# Patient Record
Sex: Female | Born: 1995 | Hispanic: Yes | Marital: Single | Smoking: Never smoker
Health system: Southern US, Community
[De-identification: ages and names within clinical notes are randomized; demographics above are authoritative.]

## PROBLEM LIST (undated history)

## (undated) ENCOUNTER — Inpatient Hospital Stay (HOSPITAL_COMMUNITY): Payer: Self-pay

## (undated) ENCOUNTER — Ambulatory Visit: Admission: EM | Payer: BC Managed Care – PPO | Source: Home / Self Care

## (undated) DIAGNOSIS — D649 Anemia, unspecified: Secondary | ICD-10-CM

## (undated) HISTORY — PX: NO PAST SURGERIES: SHX2092

## (undated) HISTORY — DX: Anemia, unspecified: D64.9

---
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
## 2016-01-10 ENCOUNTER — Telehealth (HOSPITAL_COMMUNITY): Payer: Self-pay

## 2016-01-10 NOTE — Telephone Encounter (Signed)
Patients appt was rescheduled do to therapist is out with family emergency Will see her on 01-18-16.

## 2016-01-11 ENCOUNTER — Ambulatory Visit (HOSPITAL_COMMUNITY): Payer: No Typology Code available for payment source | Admitting: Physical Therapy

## 2016-01-18 ENCOUNTER — Ambulatory Visit (HOSPITAL_COMMUNITY): Payer: No Typology Code available for payment source | Attending: Orthopedic Surgery

## 2016-01-18 DIAGNOSIS — M5382 Other specified dorsopathies, cervical region: Secondary | ICD-10-CM

## 2016-01-18 DIAGNOSIS — M436 Torticollis: Secondary | ICD-10-CM | POA: Diagnosis present

## 2016-01-18 DIAGNOSIS — M542 Cervicalgia: Secondary | ICD-10-CM | POA: Insufficient documentation
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
## 2016-01-21 ENCOUNTER — Ambulatory Visit (HOSPITAL_COMMUNITY): Payer: No Typology Code available for payment source | Admitting: Physical Therapy

## 2016-01-25 ENCOUNTER — Ambulatory Visit (HOSPITAL_COMMUNITY): Payer: No Typology Code available for payment source | Admitting: Physical Therapy

## 2016-01-27 ENCOUNTER — Ambulatory Visit (HOSPITAL_COMMUNITY): Payer: No Typology Code available for payment source

## 2016-01-27 DIAGNOSIS — M436 Torticollis: Secondary | ICD-10-CM

## 2016-01-27 DIAGNOSIS — M542 Cervicalgia: Secondary | ICD-10-CM

## 2016-01-27 DIAGNOSIS — M5382 Other specified dorsopathies, cervical region: Secondary | ICD-10-CM

## 2016-01-27 NOTE — Therapy (Signed)
[place], [place], [address] Phone: [phone]   Fax:  [phone]  Physical Therapy Treatment  Patient Details  Name: Angela White MRN: [id number] Date of Birth: 10/20/1995 Referring Provider: Lynann Bologna   Encounter Date: 01/27/2016      PT End of Session - 01/27/16 1310    Visit Number 2   Number of Visits 16   Date for PT Re-Evaluation 02/18/16   Authorization Time Period 01/18/16-03/19/16 (2 months)    Authorization - Visit Number 2   Authorization - Number of Visits 16   PT Start Time 1118   PT Stop Time 1206   PT Time Calculation (min) 48 min   Activity Tolerance Patient tolerated treatment well;No increased pain   Behavior During Therapy [facility] for tasks assessed/performed      Past Medical History  Diagnosis Date  . Anemia     No past surgical history on file.  There were no vitals filed for this visit.      Subjective Assessment - 01/27/16 1121    Subjective Pt was here 9 days ago for eval. She now has a job at [employer] in [place], and [place] her summer classes. Pt reports she has been working on HEP 1x daily and is feeling better.    Currently in Pain? No/denies            [facility] PT Assessment - 01/27/16 0001    ROM / Strength   AROM / PROM / Strength PROM   PROM   Cervical Flexion chin touches sternum  27 degrees on 5/9 (eval)   Cervical Extension 54 degrees   18 degrees 5/9 (eval)    Cervical - Right Side Bend 60  30 on 5/9   Cervical - Left Side Bend 60  14 on 5/9 at eval   Cervical - Right Rotation 73 degrees (after stretches)   28 degrees on 5/9 (eval)    Cervical - Left Rotation 78 degrees (after stretches)   14 degrees on 5/9 (eval)    Flexibility   Soft Tissue Assessment /Muscle Length --  Mild tightness in upper traps L>R                     [facility] Adult PT Treatment/Exercise - 01/27/16 0001    Exercises   Exercises Neck;Shoulder   Neck  Exercises: Seated   Cervical Rotation --  moved to supine   Neck Exercises: Supine   Neck Retraction 10 reps;5 secs  into 2 pillowspillow   Other Supine Exercise AAROM c BUE: rotation 10x5sec bilat  63 degrees rotation bilat   Other Supine Exercise Bridge with towel roll at C7/T1  1x15 for HEP education   Shoulder Exercises: Supine   Other Supine Exercises Deep neck flexor endurance training  10x3 seconds (supine chintuck + 1" elevation) HEP ed   Manual Therapy   Manual Therapy Passive ROM   Passive ROM upper cervical AA rotation stretch   3x45 sec   Neck Exercises: Stretches   Upper Trapezius Stretch 3 reps;30 seconds   Upper Trapezius Stretch Limitations supine sidebending + scapular depression   Other Neck Stretches supine, end-range flexion + rotation (AO rotation)    3x30sec supine B, 1x30sec B sitting for HEP education                PT Education - 01/27/16 1310    Education provided Yes   Education Details Updated HEP; explained the role  of deep cervical flexors and current dominance by superficial system.    Person(s) Educated Patient   Methods Demonstration;Explanation   Comprehension Verbalized understanding;Returned demonstration          PT Short Term Goals - 01/27/16 1318    PT SHORT TERM GOAL #1   Title Pt will be in in HEP after 2 weeks.    Status Achieved   PT SHORT TERM GOAL #2   Title After 4 weeks patient will double tolerated sitting time to improve ability to participate in scholatic activities.    Status On-going   PT SHORT TERM GOAL #3   Title After 4 weeks, pt will increase cervical rotation ROM by 100% to improve safety and ease during driving.    Baseline At second visit, rotation increased 2-3x in range. flexion extention WNL now.    Status Achieved           PT Long Term Goals - 01/27/16 1319    PT LONG TERM GOAL #1   Title After 7 weeks pt will be indep in advanced HEP fro DC.    Status On-going   PT LONG TERM GOAL #2    Title After 8 weks pt will reports <3/10 pain during all functional activities to improve tolerance with turns and hair washing.    Status Partially Met   PT LONG TERM GOAL #3   Title After 8 weeks, pt will demonstrate >75 degrees cervical rotation bilat, >40 cervical extention, and >30 degree lateral flexion bilat, all without exacerbation of pain, to improve ability to safely drive and more easily perform hair washing.    Status Achieved               Plan - 01/27/16 1311    Clinical Impression Statement Pt making excellent progress toward goals, demonstrating improved ROM and activity tolerance after performing HEP independently for 8 days since evaluation. Part of HEP have been DC's and new actiivties added to continue progress. The patient is pleased with her porgress thus far, is a fast learner and is able to perform  new HEP activities as directed at end of session. I have asked patient to cancel her next scheduled appointment and return in 1 week due to great progress and high level of indep.    Rehab Potential Good   PT Frequency 2x / week   PT Duration 8 weeks   PT Treatment/Interventions Moist Heat;Therapeutic activities;Therapeutic exercise;Functional mobility training;Traction;Patient/family education;Manual techniques;Taping;Passive range of motion   PT Next Visit Plan Review new HEP additions from visit 2. Assess BUE overhead mechanics. Shoulder extention with band, and standing shoulder T's with band. Suspect DC will be approprait at 4th visit.    PT Home Exercise Plan DC'd Flexion and Extention; added bridging with towel roll at C7, high cervical rotational stretch, and Deep neck flexor endurance training.     Consulted and Agree with Plan of Care Patient      Patient will benefit from skilled therapeutic intervention in order to improve the following deficits and impairments:  Decreased activity tolerance, Decreased endurance, Decreased knowledge of precautions, Decreased  mobility, Decreased strength, Hypomobility, Increased muscle spasms  Visit Diagnosis: Cervicalgia  Neck muscle weakness  Neck rigidity     Problem List Patient Active Problem List   Diagnosis Date Noted  . Chlamydia 12/07/2014  . Irregular periods 12/01/2014  . BV (bacterial vaginosis) 12/01/2014    1:21 PM, 01/27/2016 Etta Grandchild, PT, DPT PRN Physical Therapist at [facility]  License # [id number] [phone] (office)      [place], [place], [address] Phone: [phone]   Fax:  [phone]  Name: Angela White MRN: [id number] Date of Birth: 06-18-1996

## 2016-02-01 ENCOUNTER — Encounter (HOSPITAL_COMMUNITY): Payer: Self-pay

## 2016-02-03 ENCOUNTER — Ambulatory Visit (HOSPITAL_COMMUNITY): Payer: No Typology Code available for payment source | Admitting: Physical Therapy

## 2016-02-03 ENCOUNTER — Telehealth (HOSPITAL_COMMUNITY): Payer: Self-pay | Admitting: Physical Therapy

## 2016-02-03 NOTE — Telephone Encounter (Signed)
She can not cone in today.

## 2016-02-08 ENCOUNTER — Encounter (HOSPITAL_COMMUNITY): Payer: Self-pay | Admitting: Physical Therapy

## 2016-02-08 ENCOUNTER — Telehealth (HOSPITAL_COMMUNITY): Payer: Self-pay

## 2016-02-08 NOTE — Telephone Encounter (Signed)
02/08/16 pt called to cancel today's appt and rescheduled.... Added on to end of schedule

## 2016-02-10 ENCOUNTER — Ambulatory Visit (HOSPITAL_COMMUNITY): Payer: BLUE CROSS/BLUE SHIELD

## 2016-02-10 ENCOUNTER — Telehealth (HOSPITAL_COMMUNITY): Payer: Self-pay

## 2016-02-10 NOTE — Telephone Encounter (Signed)
She will not be able to be here today

## 2016-02-15 ENCOUNTER — Ambulatory Visit (HOSPITAL_COMMUNITY): Payer: BLUE CROSS/BLUE SHIELD

## 2016-02-17 ENCOUNTER — Ambulatory Visit (HOSPITAL_COMMUNITY): Payer: BLUE CROSS/BLUE SHIELD | Attending: Orthopedic Surgery | Admitting: Physical Therapy

## 2016-02-17 DIAGNOSIS — M542 Cervicalgia: Secondary | ICD-10-CM | POA: Diagnosis not present

## 2016-02-17 DIAGNOSIS — M436 Torticollis: Secondary | ICD-10-CM

## 2016-02-17 DIAGNOSIS — M5382 Other specified dorsopathies, cervical region: Secondary | ICD-10-CM | POA: Diagnosis present
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
## 2016-02-22 ENCOUNTER — Encounter (HOSPITAL_COMMUNITY): Payer: Self-pay

## 2016-02-24 ENCOUNTER — Encounter (HOSPITAL_COMMUNITY): Payer: Self-pay

## 2016-02-29 ENCOUNTER — Encounter (HOSPITAL_COMMUNITY): Payer: Self-pay

## 2016-03-02 ENCOUNTER — Encounter (HOSPITAL_COMMUNITY): Payer: Self-pay

## 2016-03-07 ENCOUNTER — Encounter (HOSPITAL_COMMUNITY): Payer: Self-pay

## 2016-03-09 ENCOUNTER — Encounter (HOSPITAL_COMMUNITY): Payer: Self-pay | Admitting: Physical Therapy

## 2016-03-13 ENCOUNTER — Encounter (HOSPITAL_COMMUNITY): Payer: Self-pay | Admitting: Physical Therapy

## 2016-03-16 ENCOUNTER — Encounter (HOSPITAL_COMMUNITY): Payer: Self-pay | Admitting: Physical Therapy
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
## 2017-01-01 ENCOUNTER — Encounter: Payer: No Typology Code available for payment source | Admitting: Women's Health

## 2017-01-09 ENCOUNTER — Encounter: Payer: Self-pay | Admitting: *Deleted

## 2017-01-09 ENCOUNTER — Encounter: Payer: No Typology Code available for payment source | Admitting: Women's Health

## 2017-03-15 ENCOUNTER — Ambulatory Visit (INDEPENDENT_AMBULATORY_CARE_PROVIDER_SITE_OTHER): Payer: BLUE CROSS/BLUE SHIELD | Admitting: Women's Health

## 2017-03-15 ENCOUNTER — Encounter: Payer: Self-pay | Admitting: Women's Health

## 2017-03-15 VITALS — BP 100/60 | HR 78 | Ht 60.0 in | Wt 106.0 lb

## 2017-03-15 DIAGNOSIS — Z3201 Encounter for pregnancy test, result positive: Secondary | ICD-10-CM | POA: Diagnosis not present

## 2017-03-15 DIAGNOSIS — Z3490 Encounter for supervision of normal pregnancy, unspecified, unspecified trimester: Secondary | ICD-10-CM

## 2017-03-15 DIAGNOSIS — Z3491 Encounter for supervision of normal pregnancy, unspecified, first trimester: Secondary | ICD-10-CM

## 2017-03-15 LAB — POCT URINE PREGNANCY: Preg Test, Ur: POSITIVE — AB

## 2017-03-15 MED ORDER — CITRANATAL ASSURE 35-1 & 300 MG PO MISC: ORAL | 11 refills | Status: DC

## 2017-03-15 NOTE — Patient Instructions (Signed)

## 2017-03-15 NOTE — Progress Notes (Signed)
   [facility] Visit  Patient name: Angela White Sider MRN [id number]  Date of birth: 1996/06/12  CC & HPI:  Angela White Ribera is a 21 y.o. G0 Hispanic female at 1860w0d by uncertain LMP presenting today for +HPT. Some mild nausea. Not taking pnv. Not on any meds. No chronic medical conditions.  Patient's last menstrual period was 01/25/2017.  Pertinent History Reviewed:  Medical & Surgical Hx:   Past medical, surgical, family, and social history reviewed in electronic medical record Medications: Reviewed & Updated - see associated section Allergies: Reviewed in electronic medical record  Objective Findings:  Vitals: BP 100/60   Pulse 78   Ht 5' (1.524 m)   Wt 106 lb (48.1 kg)   LMP 01/25/2017   BMI 20.70 kg/m  Body mass index is 20.7 kg/m.  Physical Examination: General appearance - alert, well appearing, and in no distress  Results for orders placed or performed in visit on 03/15/17 (from the past 24 hour(s))  POCT urine pregnancy   Collection Time: 03/15/17  3:01 PM  Result Value Ref Range   Preg Test, Ur Positive (A) Negative     Assessment & Plan:  A:   8060w0d pregnant by uncertain LMP  Mild nausea  P:  Gave printed n/v relief measures and 1st trimester of pregnancy info  Rx citranatal assure w/ 11RF  Return in about 1 week (around 03/22/2017) for dating u/s.  Marge DuncansBooker, Quinlin Conant Randall CNM, [facility] Rehabilitation[facility]NP-BC 03/15/2017 3:59 PM

## 2017-03-20 ENCOUNTER — Ambulatory Visit (INDEPENDENT_AMBULATORY_CARE_PROVIDER_SITE_OTHER): Payer: BLUE CROSS/BLUE SHIELD

## 2017-03-20 DIAGNOSIS — Z3491 Encounter for supervision of normal pregnancy, unspecified, first trimester: Secondary | ICD-10-CM

## 2017-03-20 NOTE — Progress Notes (Signed)
US 6+6 wks,single IUP w/ys pos fht 137 bpm,normal ovaries bilat,crl 9.39 mm,EDD 11/07/2017

## 2017-03-23 ENCOUNTER — Telehealth: Payer: Self-pay | Admitting: Women's Health

## 2017-03-23 MED ORDER — PROMETHAZINE HCL 25 MG PO TABS: 12.5000 mg | ORAL_TABLET | Freq: Four times a day (QID) | ORAL | 0 refills | Status: DC | PRN

## 2017-03-23 NOTE — Telephone Encounter (Signed)
Pt called stating that she has been nauseas and throwing up and she states that she would like for Kim to prescribe her a medication for nausea. Please contact pt

## 2017-03-23 NOTE — Telephone Encounter (Signed)
Nausea and vomiting. Would like something sent for nausea. Please advise.

## 2017-03-26 ENCOUNTER — Encounter: Payer: Self-pay | Admitting: Obstetrics and Gynecology

## 2017-04-03 ENCOUNTER — Ambulatory Visit: Payer: BLUE CROSS/BLUE SHIELD | Admitting: *Deleted

## 2017-04-03 ENCOUNTER — Encounter: Payer: Self-pay | Admitting: Women's Health

## 2017-04-03 ENCOUNTER — Ambulatory Visit (INDEPENDENT_AMBULATORY_CARE_PROVIDER_SITE_OTHER): Payer: BLUE CROSS/BLUE SHIELD | Admitting: Women's Health

## 2017-04-03 VITALS — BP 88/48 | HR 70 | Wt 106.0 lb

## 2017-04-03 DIAGNOSIS — Z1389 Encounter for screening for other disorder: Secondary | ICD-10-CM

## 2017-04-03 DIAGNOSIS — Z3682 Encounter for antenatal screening for nuchal translucency: Secondary | ICD-10-CM

## 2017-04-03 DIAGNOSIS — Z3A08 8 weeks gestation of pregnancy: Secondary | ICD-10-CM

## 2017-04-03 DIAGNOSIS — Z3400 Encounter for supervision of normal first pregnancy, unspecified trimester: Secondary | ICD-10-CM | POA: Insufficient documentation

## 2017-04-03 DIAGNOSIS — Z3401 Encounter for supervision of normal first pregnancy, first trimester: Secondary | ICD-10-CM | POA: Diagnosis not present

## 2017-04-03 DIAGNOSIS — Z331 Pregnant state, incidental: Secondary | ICD-10-CM

## 2017-04-03 LAB — POCT URINALYSIS DIPSTICK
Blood, UA: NEGATIVE
Glucose, UA: NEGATIVE
KETONES UA: NEGATIVE
Nitrite, UA: NEGATIVE
PROTEIN UA: NEGATIVE

## 2017-04-03 NOTE — Progress Notes (Signed)
  Subjective:  Angela White is a 21 y.o. G1P0 Hispanic female at 3892w6d by 27663w6d being seen today for her first obstetrical visit.  Her obstetrical history is significant for primigravida.  Pregnancy history fully reviewed.  Patient reports n/v- hasn't gotten phenergan filled, didn't know it was sent in. Denies vb, cramping, uti s/s, abnormal/malodorous vag d/c, or vulvovaginal itching/irritation.  BP (!) 88/48   Pulse 70   Wt 106 lb (48.1 kg)   LMP  (LMP Unknown)   BMI 20.70 kg/m   HISTORY: OB History  Gravida Para Term Preterm AB Living  1            SAB TAB Ectopic Multiple Live Births               # Outcome Date GA Lbr Len/2nd Weight Sex Delivery Anes PTL Lv  1 Current              Past Medical History:  Diagnosis Date  . Anemia    Past Surgical History:  Procedure Laterality Date  . NO PAST SURGERIES     Family History  Problem Relation Age of Onset  . Gestational diabetes Mother   . Diabetes Maternal Grandmother   . Hyperlipidemia Maternal Grandmother   . Diabetes Maternal Uncle     Exam   System:     General: Well developed & nourished, no acute distress   Skin: Warm & dry, normal coloration and turgor, no rashes   Neurologic: Alert & oriented, normal mood   Cardiovascular: Regular rate & rhythm   Respiratory: Effort & rate normal, LCTAB, acyanotic   Abdomen: Soft, non tender   Extremities: normal strength, tone  Thin prep pap smear n/a <21yo FHR: + via informal transvaginal u/s, active fetus   Assessment:   Pregnancy: G1P0 Patient Active Problem List   Diagnosis Date Noted  . Supervision of normal first pregnancy 04/03/2017  . Pregnant 03/15/2017  . Chlamydia 12/07/2014  . Irregular periods 12/01/2014  . BV (bacterial vaginosis) 12/01/2014    6392w6d G1P0 New OB visit N/V of pregnancy  Plan:  Initial labs obtained Continue prenatal vitamins Problem list reviewed and updated Reviewed n/v relief measures and warning s/s to report Pick  up phenergan and take as directed for n/v Reviewed recommended weight gain based on pre-gravid BMI Encouraged well-balanced diet Genetic Screening discussed Integrated Screen: requested Cystic fibrosis screening discussed requested Ultrasound discussed; fetal survey: requested Follow up in 4 weeks for visit and 1st IT/NT CCNC completed  Marge DuncansBooker, Jalaiyah Throgmorton Randall CNM, [facility]NP[facility]BC 04/03/2017 2:36 PM

## 2017-04-03 NOTE — Patient Instructions (Signed)

## 2017-04-04 ENCOUNTER — Encounter: Payer: Self-pay | Admitting: Women's Health

## 2017-04-04 DIAGNOSIS — F1290 Cannabis use, unspecified, uncomplicated: Secondary | ICD-10-CM | POA: Insufficient documentation

## 2017-04-04 LAB — MED LIST OPTION NOT SELECTED

## 2017-04-05 LAB — URINE CULTURE: Organism ID, Bacteria: NO GROWTH

## 2017-04-06 LAB — PMP SCREEN PROFILE (10S), URINE
Amphetamine Scrn, Ur: NEGATIVE ng/mL
BARBITURATE SCREEN URINE: NEGATIVE ng/mL
BENZODIAZEPINE SCREEN, URINE: NEGATIVE ng/mL
CANNABINOIDS UR QL SCN: POSITIVE ng/mL — AB
COCAINE(METAB.)SCREEN, URINE: NEGATIVE ng/mL
Creatinine(Crt), U: 138.7 mg/dL (ref 20.0–300.0)
METHADONE SCREEN, URINE: NEGATIVE ng/mL
OPIATE SCREEN URINE: NEGATIVE ng/mL
OXYCODONE+OXYMORPHONE UR QL SCN: NEGATIVE ng/mL
Ph of Urine: 6.3 (ref 4.5–8.9)
Phencyclidine Qn, Ur: NEGATIVE ng/mL
Propoxyphene Scrn, Ur: NEGATIVE ng/mL

## 2017-04-06 LAB — GC/CHLAMYDIA PROBE AMP
Chlamydia trachomatis, NAA: NEGATIVE
Neisseria gonorrhoeae by PCR: NEGATIVE

## 2017-04-10 LAB — CBC
Hematocrit: 44.1 % (ref 34.0–46.6)
Hemoglobin: 15 g/dL (ref 11.1–15.9)
MCH: 27.8 pg (ref 26.6–33.0)
MCHC: 34 g/dL (ref 31.5–35.7)
MCV: 82 fL (ref 79–97)
Platelets: 273 10*3/uL (ref 150–379)
RBC: 5.39 x10E6/uL — ABNORMAL HIGH (ref 3.77–5.28)
RDW: 17.1 % — AB (ref 12.3–15.4)
WBC: 12.9 10*3/uL — ABNORMAL HIGH (ref 3.4–10.8)

## 2017-04-10 LAB — ANTIBODY SCREEN: Antibody Screen: NEGATIVE

## 2017-04-10 LAB — URINALYSIS, ROUTINE W REFLEX MICROSCOPIC
Bilirubin, UA: NEGATIVE
Glucose, UA: NEGATIVE
Ketones, UA: NEGATIVE
NITRITE UA: NEGATIVE
Protein, UA: NEGATIVE
RBC, UA: NEGATIVE
Specific Gravity, UA: 1.02 (ref 1.005–1.030)
UUROB: 1 mg/dL (ref 0.2–1.0)
pH, UA: 6.5 (ref 5.0–7.5)

## 2017-04-10 LAB — VARICELLA ZOSTER ANTIBODY, IGG: Varicella zoster IgG: 829 index (ref >165)

## 2017-04-10 LAB — MICROSCOPIC EXAMINATION: CASTS: NONE SEEN /LPF

## 2017-04-10 LAB — SICKLE CELL SCREEN: SICKLE CELL SCREEN: NEGATIVE

## 2017-04-10 LAB — ABO/RH: Rh Factor: POSITIVE

## 2017-04-10 LAB — RPR: RPR: NONREACTIVE

## 2017-04-10 LAB — CYSTIC FIBROSIS MUTATION 97: Interpretation: NOT DETECTED

## 2017-04-10 LAB — HEPATITIS B SURFACE ANTIGEN: Hepatitis B Surface Ag: NEGATIVE

## 2017-04-10 LAB — RUBELLA SCREEN: RUBELLA: 1.35 {index} (ref >0.99)

## 2017-04-10 LAB — HIV ANTIBODY (ROUTINE TESTING W REFLEX): HIV Screen 4th Generation wRfx: NONREACTIVE

## 2017-04-13 ENCOUNTER — Telehealth: Payer: Self-pay | Admitting: *Deleted

## 2017-04-13 NOTE — Telephone Encounter (Signed)
Pt called stating that her stools are really hard and it hurts when she has a bowel movement. Advised pt to increase her fluid and fiber intake. Advised pt she could try taking colace. Advised pt to call us back if she continued to experience difficulty or if it got worse. Pt verbalized understanding.

## 2017-04-25 ENCOUNTER — Telehealth: Payer: Self-pay | Admitting: Women's Health

## 2017-04-25 NOTE — Telephone Encounter (Signed)
Patient called stating that she would like a call back from the nurse, patient did not specify the reason please contact pt

## 2017-04-25 NOTE — Telephone Encounter (Signed)
LMOVM that I attempted to return pts call.

## 2017-05-01 ENCOUNTER — Encounter: Payer: Self-pay | Admitting: Women's Health

## 2017-05-01 ENCOUNTER — Ambulatory Visit (INDEPENDENT_AMBULATORY_CARE_PROVIDER_SITE_OTHER): Payer: BLUE CROSS/BLUE SHIELD | Admitting: Women's Health

## 2017-05-01 ENCOUNTER — Ambulatory Visit (INDEPENDENT_AMBULATORY_CARE_PROVIDER_SITE_OTHER): Payer: BLUE CROSS/BLUE SHIELD

## 2017-05-01 VITALS — BP 90/50 | HR 72 | Wt 107.0 lb

## 2017-05-01 DIAGNOSIS — Z3682 Encounter for antenatal screening for nuchal translucency: Secondary | ICD-10-CM

## 2017-05-01 DIAGNOSIS — Z3401 Encounter for supervision of normal first pregnancy, first trimester: Secondary | ICD-10-CM

## 2017-05-01 DIAGNOSIS — Z3A12 12 weeks gestation of pregnancy: Secondary | ICD-10-CM

## 2017-05-01 DIAGNOSIS — Z331 Pregnant state, incidental: Secondary | ICD-10-CM

## 2017-05-01 DIAGNOSIS — Z1389 Encounter for screening for other disorder: Secondary | ICD-10-CM

## 2017-05-01 LAB — POCT URINALYSIS DIPSTICK
Blood, UA: NEGATIVE
Glucose, UA: NEGATIVE
KETONES UA: NEGATIVE
LEUKOCYTES UA: NEGATIVE
NITRITE UA: NEGATIVE

## 2017-05-01 NOTE — Patient Instructions (Signed)

## 2017-05-01 NOTE — Progress Notes (Signed)
Low-risk OB appointment G1P0 [redacted]w[redacted]d Estimated Date of Delivery: 11/07/17 BP (!) 90/50   Pulse 72   Wt 107 lb (48.5 kg)   LMP  (LMP Unknown)   BMI 20.90 kg/m   BP, weight, and urine reviewed.  Refer to obstetrical flow sheet for FH & FHR.  No fm yet. Denies cramping, lof, vb, or uti s/s. No complaints. Reviewed today's normal nt u/s, warning s/s to report. Plan:  Continue routine obstetrical care  F/U in 3wks for OB appointment and 2nd IT 1st IT/NT today

## 2017-05-01 NOTE — Progress Notes (Signed)
[place] 12+6 wks,measurements c/w dates,NB present,NT 1.8 mm,normal ovaries bilat,crl 68.69 mm,post pl gr 0,fhr 162 bpm

## 2017-05-02 ENCOUNTER — Telehealth: Payer: Self-pay | Admitting: Women's Health

## 2017-05-02 NOTE — Telephone Encounter (Signed)
Patient called with complaints of light pink discharge that started around lunch today. No intercourse recently, cramping but not as bad now but still has discharge. Thinks she is doing too much as work. Patient was just seen yesterday. Advised patient to continue to push fluids, rest when able, no heavy lifting or pulling at work and to let us know if bleeding or cramping became worse. Verbalized understanding.

## 2017-05-02 NOTE — Telephone Encounter (Signed)
Light pink is not concerning to me  Agree w/your advice

## 2017-05-07 LAB — MATERNAL SCREEN, INTEGRATED #1
Maternal Age at EDD: 21.4 yr
PAPP-A Value: 2041.9 ng/mL

## 2017-05-09 ENCOUNTER — Other Ambulatory Visit: Payer: Self-pay | Admitting: Advanced Practice Midwife

## 2017-05-09 ENCOUNTER — Telehealth: Payer: Self-pay | Admitting: *Deleted

## 2017-05-09 MED ORDER — PROMETHAZINE HCL 25 MG PO TABS: 12.5000 mg | ORAL_TABLET | Freq: Four times a day (QID) | ORAL | 3 refills | Status: DC | PRN

## 2017-05-09 NOTE — Progress Notes (Signed)
Refilled phenergan

## 2017-05-09 NOTE — Telephone Encounter (Signed)
Pt informed that Rx for phenergan had been refilled as she requested.

## 2017-05-09 NOTE — Telephone Encounter (Signed)
done

## 2017-05-22 ENCOUNTER — Ambulatory Visit (INDEPENDENT_AMBULATORY_CARE_PROVIDER_SITE_OTHER): Payer: BLUE CROSS/BLUE SHIELD | Admitting: Advanced Practice Midwife

## 2017-05-22 ENCOUNTER — Encounter: Payer: Self-pay | Admitting: Advanced Practice Midwife

## 2017-05-22 VITALS — BP 100/60 | HR 72 | Wt 111.0 lb

## 2017-05-22 DIAGNOSIS — Z363 Encounter for antenatal screening for malformations: Secondary | ICD-10-CM

## 2017-05-22 DIAGNOSIS — Z3402 Encounter for supervision of normal first pregnancy, second trimester: Secondary | ICD-10-CM

## 2017-05-22 DIAGNOSIS — Z331 Pregnant state, incidental: Secondary | ICD-10-CM

## 2017-05-22 DIAGNOSIS — Z3A15 15 weeks gestation of pregnancy: Secondary | ICD-10-CM

## 2017-05-22 DIAGNOSIS — Z1389 Encounter for screening for other disorder: Secondary | ICD-10-CM

## 2017-05-22 DIAGNOSIS — Z1379 Encounter for other screening for genetic and chromosomal anomalies: Secondary | ICD-10-CM

## 2017-05-22 NOTE — Patient Instructions (Signed)

## 2017-05-22 NOTE — Addendum Note (Signed)
Addended by: Moss McRESENZO, LATISHA M on: 05/22/2017 03:39 PM   Modules accepted: Orders

## 2017-05-22 NOTE — Progress Notes (Signed)
G1P0 6010w6d Estimated Date of Delivery: 11/07/17  Blood pressure 100/60, pulse 72, weight 111 lb (50.3 kg).   BP weight and urine results all reviewed and noted.  Please refer to the obstetrical flow sheet for the fundal height and fetal heart rate documentation:  Patient denies any bleeding and no rupture of membranes symptoms or regular contractions. Patient is without complaints. All questions were answered.  Orders Placed This Encounter  Procedures  . US OB Comp + 14 Wk  . INTEGRATED 2  . POCT urinalysis dipstick    Plan:  Continued routine obstetrical care,   Return in about 3 weeks (around 06/12/2017) for LROB, BM:WUXLKGMS:Anatomy.

## 2017-05-23 LAB — INTEGRATED 1
CROWN RUMP LENGTH MAT SCREEN: 68.6 mm
GEST. AGE ON COLLECTION DATE: 12.9 wk
Maternal Age at EDD: 21.1 yr
Nuchal Translucency (NT): 1.8 mm
Number of Fetuses: 1
PAPP-A VALUE: 2041.9 ng/mL
WEIGHT: 107 [lb_av]

## 2017-05-24 LAB — INTEGRATED 2
ADSF: 1.22
AFP MOM: 1.05
Alpha-Fetoprotein: 37.8 ng/mL
Crown Rump Length: 68.6 mm
DIA MoM: 0.9
DIA Value: 186.7 pg/mL
Estriol, Unconjugated: 0.96 ng/mL
GEST. AGE ON COLLECTION DATE: 12.9 wk
Gestational Age: 15.9 weeks
HCG MOM: 0.96
HCG VALUE: 39.4 [IU]/mL
MATERNAL AGE AT EDD: 21.1 a
NUMBER OF FETUSES: 1
Nuchal Translucency (NT): 1.8 mm
Nuchal Translucency MoM: 1.06
PAPP-A MOM: 1.3
PAPP-A VALUE: 2041.9 ng/mL
Test Results:: NEGATIVE
WEIGHT: 107 [lb_av]
WEIGHT: 111 [lb_av]

## 2017-06-05 ENCOUNTER — Telehealth: Payer: Self-pay | Admitting: Obstetrics & Gynecology

## 2017-06-05 NOTE — Telephone Encounter (Signed)
Spoke with pt. Pt fell down stairs last pm and feels like she broke her ankle. I advised can go to Urgent Care or [facility] ER for eval. Pt voiced understanding. JSY

## 2017-06-12 ENCOUNTER — Ambulatory Visit (INDEPENDENT_AMBULATORY_CARE_PROVIDER_SITE_OTHER): Payer: BLUE CROSS/BLUE SHIELD | Admitting: Advanced Practice Midwife

## 2017-06-12 ENCOUNTER — Encounter: Payer: Self-pay | Admitting: Advanced Practice Midwife

## 2017-06-12 ENCOUNTER — Ambulatory Visit (INDEPENDENT_AMBULATORY_CARE_PROVIDER_SITE_OTHER): Payer: BLUE CROSS/BLUE SHIELD

## 2017-06-12 VITALS — BP 118/60 | HR 75 | Wt 114.5 lb

## 2017-06-12 DIAGNOSIS — Z3402 Encounter for supervision of normal first pregnancy, second trimester: Secondary | ICD-10-CM

## 2017-06-12 DIAGNOSIS — Z3A18 18 weeks gestation of pregnancy: Secondary | ICD-10-CM

## 2017-06-12 DIAGNOSIS — Z1389 Encounter for screening for other disorder: Secondary | ICD-10-CM

## 2017-06-12 DIAGNOSIS — Z331 Pregnant state, incidental: Secondary | ICD-10-CM

## 2017-06-12 DIAGNOSIS — Z363 Encounter for antenatal screening for malformations: Secondary | ICD-10-CM | POA: Diagnosis not present

## 2017-06-12 LAB — POCT URINALYSIS DIPSTICK
Blood, UA: NEGATIVE
GLUCOSE UA: NEGATIVE
KETONES UA: NEGATIVE
Nitrite, UA: NEGATIVE
Protein, UA: NEGATIVE

## 2017-06-12 NOTE — Progress Notes (Signed)
[place] 18+6 wks,breech,cx 4.6 cm,normal ovaries bilat,post pl gr 0,LVEICF 1.2 mm,fhr 150 bpm,svp of fluid 6 cm,efw 258 g,anatomy complete

## 2017-06-12 NOTE — Progress Notes (Signed)
G1P0 [redacted]w[redacted]d Estimated Date of Delivery: 11/07/17  Blood pressure 118/60, pulse 75, weight 114 lb 8 oz (51.9 kg).   BP weight and urine results all reviewed and noted.  Please refer to the obstetrical flow sheet for the fundal height and fetal heart rate documentation: [place] 18+6 wks,breech,cx 4.6 cm,normal ovaries bilat,post pl gr 0,LVEICF 1.2 mm,fhr 150 bpm,svp of fluid 6 cm,efw 258 g,anatomy complete Patient reports good fetal movement, denies any bleeding and no rupture of membranes symptoms or regular contractions. Patient is without complaints. All questions were answered.  Orders Placed This Encounter  Procedures  . POCT urinalysis dipstick    Plan:  Continued routine obstetrical care,   Return in about 4 weeks (around 07/10/2017) for LROB.

## 2017-06-12 NOTE — Patient Instructions (Signed)

## 2017-06-20 ENCOUNTER — Inpatient Hospital Stay (HOSPITAL_COMMUNITY)
Admission: AD | Admit: 2017-06-20 | Discharge: 2017-06-20 | Discharge status: Against Medical Advice | Payer: BLUE CROSS/BLUE SHIELD | Source: Ambulatory Visit | Attending: Obstetrics and Gynecology | Admitting: Obstetrics and Gynecology

## 2017-06-20 ENCOUNTER — Telehealth: Payer: Self-pay | Admitting: Women's Health

## 2017-06-20 ENCOUNTER — Telehealth: Payer: Self-pay | Admitting: *Deleted

## 2017-06-20 NOTE — Progress Notes (Signed)
Called pt's name, not in lobby.  

## 2017-06-20 NOTE — Telephone Encounter (Signed)
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 She is having a lot of pain and her baby is not moving. I advised that patient go to the emergency room for evaluation. Patient is agreeable to this. She has no further questions or concerns.

## 2017-06-20 NOTE — Telephone Encounter (Signed)
Pt called requesting note be sent to her work stating her lifting restrictions. Informed pt that we would send that over for her.

## 2017-06-20 NOTE — Progress Notes (Signed)
Called pt, not in lobby.  

## 2017-06-20 NOTE — Progress Notes (Signed)
Pt not in lobby, name called.

## 2017-06-20 NOTE — Telephone Encounter (Signed)
Patient called stating that she is needing a letter for her job stating how much she can lift, pt would like the note faxed to her job @  [phone].

## 2017-06-21 ENCOUNTER — Ambulatory Visit (INDEPENDENT_AMBULATORY_CARE_PROVIDER_SITE_OTHER): Payer: BLUE CROSS/BLUE SHIELD | Admitting: Advanced Practice Midwife

## 2017-06-21 ENCOUNTER — Encounter: Payer: Self-pay | Admitting: Advanced Practice Midwife

## 2017-06-21 VITALS — BP 100/50 | HR 86 | Wt 118.8 lb

## 2017-06-21 DIAGNOSIS — Z1389 Encounter for screening for other disorder: Secondary | ICD-10-CM

## 2017-06-21 DIAGNOSIS — Z3A20 20 weeks gestation of pregnancy: Secondary | ICD-10-CM

## 2017-06-21 DIAGNOSIS — Z3402 Encounter for supervision of normal first pregnancy, second trimester: Secondary | ICD-10-CM

## 2017-06-21 DIAGNOSIS — Z331 Pregnant state, incidental: Secondary | ICD-10-CM

## 2017-06-21 LAB — POCT URINALYSIS DIPSTICK
Blood, UA: NEGATIVE
GLUCOSE UA: NEGATIVE
KETONES UA: NEGATIVE
Nitrite, UA: NEGATIVE
Protein, UA: NEGATIVE

## 2017-06-21 NOTE — Progress Notes (Signed)
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 Went and registered and then left. Because she was in waiting room for >3 hours  Today she feels sore, has a small bruise on her abdomen.  Uterus non tender, no bleeding, fetus very active on doppler.   Abruption precautions given, note for work w/lifting restrictions given

## 2017-07-10 ENCOUNTER — Ambulatory Visit (INDEPENDENT_AMBULATORY_CARE_PROVIDER_SITE_OTHER): Payer: BLUE CROSS/BLUE SHIELD | Admitting: Women's Health

## 2017-07-10 ENCOUNTER — Encounter: Payer: Self-pay | Admitting: Women's Health

## 2017-07-10 VITALS — BP 98/60 | HR 88 | Wt 125.0 lb

## 2017-07-10 DIAGNOSIS — Z1389 Encounter for screening for other disorder: Secondary | ICD-10-CM

## 2017-07-10 DIAGNOSIS — Z3A22 22 weeks gestation of pregnancy: Secondary | ICD-10-CM

## 2017-07-10 DIAGNOSIS — F1290 Cannabis use, unspecified, uncomplicated: Secondary | ICD-10-CM

## 2017-07-10 DIAGNOSIS — Z331 Pregnant state, incidental: Secondary | ICD-10-CM

## 2017-07-10 DIAGNOSIS — O283 Abnormal ultrasonic finding on antenatal screening of mother: Secondary | ICD-10-CM | POA: Insufficient documentation

## 2017-07-10 DIAGNOSIS — Z3402 Encounter for supervision of normal first pregnancy, second trimester: Secondary | ICD-10-CM

## 2017-07-10 LAB — POCT URINALYSIS DIPSTICK
Glucose, UA: NEGATIVE
Ketones, UA: NEGATIVE
Leukocytes, UA: NEGATIVE
NITRITE UA: NEGATIVE
PROTEIN UA: NEGATIVE
RBC UA: NEGATIVE

## 2017-07-10 MED ORDER — PANTOPRAZOLE SODIUM 20 MG PO TBEC: 20.0000 mg | DELAYED_RELEASE_TABLET | Freq: Every day | ORAL | 3 refills | Status: DC

## 2017-07-10 NOTE — Patient Instructions (Signed)
Angela FlowersJaquelin R White, I greatly value your feedback.  If you receive a survey following your visit with us today, we appreciate you taking the time to fill it out.  Thanks, Joellyn HaffKim Stephaun Million, CNM, WHNP-BC   You will have your sugar test next visit.  Please do not eat or drink anything after midnight the night before you come, not even water.  You will be here for at least two hours.     Call the office [phone]) or [facility] if:  You begin to have strong, frequent contractions  Your water breaks.  Sometimes it is a big gush of fluid, sometimes it is just a trickle that keeps getting your panties wet or running down your legs  You have vaginal bleeding.  It is normal to have a small amount of spotting if your cervix was checked.   You don't feel your baby moving like normal.  If you don't, get you something to eat and drink and lay down and focus on feeling your baby move.   If your baby is still not moving like normal, you should call the office or go to [facility].  Second Trimester of Pregnancy The second trimester is from week 13 through week 28, months 4 through 6. The second trimester is often a time when you feel your best. Your body has also adjusted to being pregnant, and you begin to feel better physically. Usually, morning sickness has lessened or quit completely, you may have more energy, and you may have an increase in appetite. The second trimester is also a time when the fetus is growing rapidly. At the end of the sixth month, the fetus is about 9 inches long and weighs about 1 pounds. You will likely begin to feel the baby move (quickening) between 18 and 20 weeks of the pregnancy. BODY CHANGES Your body goes through many changes during pregnancy. The changes vary from woman to woman.   Your weight will continue to increase. You will notice your lower abdomen bulging out.  You may begin to get stretch marks on your hips, abdomen, and breasts.  You may develop  headaches that can be relieved by medicines approved by your health care provider.  You may urinate more often because the fetus is pressing on your bladder.  You may develop or continue to have heartburn as a result of your pregnancy.  You may develop constipation because certain hormones are causing the muscles that push waste through your intestines to slow down.  You may develop hemorrhoids or swollen, bulging veins (varicose veins).  You may have back pain because of the weight gain and pregnancy hormones relaxing your joints between the bones in your pelvis and as a result of a shift in weight and the muscles that support your balance.  Your breasts will continue to grow and be tender.  Your gums may bleed and may be sensitive to brushing and flossing.  Dark spots or blotches (chloasma, mask of pregnancy) may develop on your face. This will likely fade after the baby is born.  A dark line from your belly button to the pubic area (linea nigra) may appear. This will likely fade after the baby is born.  You may have changes in your hair. These can include thickening of your hair, rapid growth, and changes in texture. Some women also have hair loss during or after pregnancy, or hair that feels dry or thin. Your hair will most likely return to normal after your  baby is born. WHAT TO EXPECT AT YOUR PRENATAL VISITS During a routine prenatal visit:  You will be weighed to make sure you and the fetus are growing normally.  Your blood pressure will be taken.  Your abdomen will be measured to track your baby's growth.  The fetal heartbeat will be listened to.  Any test results from the previous visit will be discussed. Your health care provider may ask you:  How you are feeling.  If you are feeling the baby move.  If you have had any abnormal symptoms, such as leaking fluid, bleeding, severe headaches, or abdominal cramping.  If you have any questions. Other tests that may be  performed during your second trimester include:  Blood tests that check for:  Low iron levels (anemia).  Gestational diabetes (between 24 and 28 weeks).  Rh antibodies.  Urine tests to check for infections, diabetes, or protein in the urine.  An ultrasound to confirm the proper growth and development of the baby.  An amniocentesis to check for possible genetic problems.  Fetal screens for spina bifida and Down syndrome. HOME CARE INSTRUCTIONS   Avoid all smoking, herbs, alcohol, and unprescribed drugs. These chemicals affect the formation and growth of the baby.  Follow your health care provider's instructions regarding medicine use. There are medicines that are either safe or unsafe to take during pregnancy.  Exercise only as directed by your health care provider. Experiencing uterine cramps is a good sign to stop exercising.  Continue to eat regular, healthy meals.  Wear a good support bra for breast tenderness.  Do not use hot tubs, steam rooms, or saunas.  Wear your seat belt at all times when driving.  Avoid raw meat, uncooked cheese, cat litter boxes, and soil used by cats. These carry germs that can cause birth defects in the baby.  Take your prenatal vitamins.  Try taking a stool softener (if your health care provider approves) if you develop constipation. Eat more high-fiber foods, such as fresh vegetables or fruit and whole grains. Drink plenty of fluids to keep your urine clear or pale yellow.  Take warm sitz baths to soothe any pain or discomfort caused by hemorrhoids. Use hemorrhoid cream if your health care provider approves.  If you develop varicose veins, wear support hose. Elevate your feet for 15 minutes, 3-4 times a day. Limit salt in your diet.  Avoid heavy lifting, wear low heel shoes, and practice good posture.  Rest with your legs elevated if you have leg cramps or low back pain.  Visit your dentist if you have not gone yet during your pregnancy.  Use a soft toothbrush to brush your teeth and be gentle when you floss.  A sexual relationship may be continued unless your health care provider directs you otherwise.  Continue to go to all your prenatal visits as directed by your health care provider. SEEK MEDICAL CARE IF:   You have dizziness.  You have mild pelvic cramps, pelvic pressure, or nagging pain in the abdominal area.  You have persistent nausea, vomiting, or diarrhea.  You have a bad smelling vaginal discharge.  You have pain with urination. SEEK IMMEDIATE MEDICAL CARE IF:   You have a fever.  You are leaking fluid from your vagina.  You have spotting or bleeding from your vagina.  You have severe abdominal cramping or pain.  You have rapid weight gain or loss.  You have shortness of breath with chest pain.  You notice sudden or extreme   swelling of your face, hands, ankles, feet, or legs.  You have not felt your baby move in over an hour.  You have severe headaches that do not go away with medicine.  You have vision changes. Document Released: 08/22/2001 Document Revised: 09/02/2013 Document Reviewed: 10/29/2012 [facility] Patient Information 2015 [place], [place]. This information is not intended to replace advice given to you by your health care provider. Make sure you discuss any questions you have with your health care provider.

## 2017-07-10 NOTE — Progress Notes (Signed)
   LOW-RISK PREGNANCY VISIT Patient name: Angela White MRN [id number]  Date of birth: [birth year] Chief Complaint:   Routine Prenatal Visit  History of Present Illness:   Angela White is a 21 y.o. G1P0 female at 2069w6d with an Estimated Date of Delivery: 11/07/17 being seen today for ongoing management of a low-risk pregnancy.  Today she reports no complaints. Contractions: Not present. Vag. Bleeding: None.  Movement: Present. denies leaking of fluid. Review of Systems:   Pertinent items are noted in HPI Denies abnormal vaginal discharge w/ itching/odor/irritation, headaches, visual changes, shortness of breath, chest pain, abdominal pain, severe nausea/vomiting, or problems with urination or bowel movements unless otherwise stated above. Pertinent History Reviewed:  Reviewed past medical,surgical, social, obstetrical and family history.  Reviewed problem list, medications and allergies. Physical Assessment:   Vitals:   07/10/17 1551  BP: 98/60  Pulse: 88  Weight: 125 lb (56.7 kg)  Body mass index is 24.41 kg/m.        Physical Examination:   General appearance: Well appearing, and in no distress  Mental status: Alert, oriented to person, place, and time  Skin: Warm & dry  Cardiovascular: Normal heart rate noted  Respiratory: Normal respiratory effort, no distress  Abdomen: Soft, gravid, nontender  Pelvic: Cervical exam deferred         Extremities: Edema: None  Fetal Status: Fetal Heart Rate (bpm): 148 Fundal Height: 20 cm Movement: Present    Results for orders placed or performed in visit on 07/10/17 (from the past 24 hour(s))  POCT Urinalysis Dipstick   Collection Time: 07/10/17  3:54 PM  Result Value Ref Range   Color, UA     Clarity, UA     Glucose, UA Neg    Bilirubin, UA     Ketones, UA neg    Spec Grav, UA  1.010 - 1.025   Blood, UA neg    pH, UA  5.0 - 8.0   Protein, UA neg    Urobilinogen, UA  0.2 or 1.0 E.U./dL   Nitrite, UA neg    Leukocytes,  UA Negative Negative    Assessment & Plan:  1) Low-risk pregnancy G1P0 at 2069w6d with an Estimated Date of Delivery: 11/07/17   2) THC+ earlier pregnancy, none since beginning of pregnancy, repeat today  3) Isolated Lt EICF on anatomy u/s> discussed, neg nt/it, gave printed info, no need for fu   Labs/procedures today: none  Plan:  Continue routine obstetrical care   Reviewed: Preterm labor symptoms and general obstetric precautions including but not limited to vaginal bleeding, contractions, leaking of fluid and fetal movement were reviewed in detail with the patient.  Recommended flu shot w/ pcp/hd (<20yo) All questions were answered  Follow-up: Return in about 4 weeks (around 08/07/2017) for LROB, PN2.  Orders Placed This Encounter  Procedures  . Pain Management Screening Profile (10S)  . POCT Urinalysis Dipstick   Marge DuncansBooker, Nikaela Coyne Randall CNM, [facility]BC 07/10/2017 4:25 PM

## 2017-07-11 LAB — PMP SCREEN PROFILE (10S), URINE
Amphetamine Scrn, Ur: NEGATIVE ng/mL
BARBITURATE SCREEN URINE: NEGATIVE ng/mL
BENZODIAZEPINE SCREEN, URINE: NEGATIVE ng/mL
CANNABINOIDS UR QL SCN: NEGATIVE ng/mL
CREATININE(CRT), U: 80.8 mg/dL (ref 20.0–300.0)
Cocaine (Metab) Scrn, Ur: NEGATIVE ng/mL
Methadone Screen, Urine: NEGATIVE ng/mL
OPIATE SCREEN URINE: NEGATIVE ng/mL
OXYCODONE+OXYMORPHONE UR QL SCN: NEGATIVE ng/mL
Ph of Urine: 6.7 (ref 4.5–8.9)
Phencyclidine Qn, Ur: NEGATIVE ng/mL
Propoxyphene Scrn, Ur: NEGATIVE ng/mL

## 2017-08-07 ENCOUNTER — Encounter: Payer: Self-pay | Admitting: Women's Health

## 2017-08-07 ENCOUNTER — Ambulatory Visit (INDEPENDENT_AMBULATORY_CARE_PROVIDER_SITE_OTHER): Payer: BLUE CROSS/BLUE SHIELD | Admitting: Women's Health

## 2017-08-07 ENCOUNTER — Other Ambulatory Visit: Payer: BLUE CROSS/BLUE SHIELD

## 2017-08-07 VITALS — BP 98/54 | HR 80 | Wt 129.0 lb

## 2017-08-07 DIAGNOSIS — Z131 Encounter for screening for diabetes mellitus: Secondary | ICD-10-CM

## 2017-08-07 DIAGNOSIS — Z3402 Encounter for supervision of normal first pregnancy, second trimester: Secondary | ICD-10-CM

## 2017-08-07 DIAGNOSIS — Z331 Pregnant state, incidental: Secondary | ICD-10-CM

## 2017-08-07 DIAGNOSIS — Z1389 Encounter for screening for other disorder: Secondary | ICD-10-CM

## 2017-08-07 DIAGNOSIS — Z3A26 26 weeks gestation of pregnancy: Secondary | ICD-10-CM

## 2017-08-07 LAB — POCT URINALYSIS DIPSTICK
Blood, UA: NEGATIVE
Glucose, UA: NEGATIVE
Ketones, UA: NEGATIVE
Leukocytes, UA: NEGATIVE
Nitrite, UA: NEGATIVE
PROTEIN UA: NEGATIVE

## 2017-08-07 NOTE — Progress Notes (Signed)
   LOW-RISK PREGNANCY VISIT Patient name: Angela White MRN [id number]  Date of birth: [birth year] Chief Complaint:   Routine Prenatal Visit (PN2)  History of Present Illness:   Angela White is a 21 y.o. G1P0 female at 3765w6d with an Estimated Date of Delivery: 11/07/17 being seen today for ongoing management of a low-risk pregnancy.  Today she reports no complaints. Contractions: Not present. Vag. Bleeding: None.  Movement: Present. denies leaking of fluid. Review of Systems:   Pertinent items are noted in HPI Denies abnormal vaginal discharge w/ itching/odor/irritation, headaches, visual changes, shortness of breath, chest pain, abdominal pain, severe nausea/vomiting, or problems with urination or bowel movements unless otherwise stated above. Pertinent History Reviewed:  Reviewed past medical,surgical, social, obstetrical and family history.  Reviewed problem list, medications and allergies. Physical Assessment:   Vitals:   08/07/17 0937  BP: (!) 98/54  Pulse: 80  Weight: 129 lb (58.5 kg)  Body mass index is 25.19 kg/m.        Physical Examination:   General appearance: Well appearing, and in no distress  Mental status: Alert, oriented to person, place, and time  Skin: Warm & dry  Cardiovascular: Normal heart rate noted  Respiratory: Normal respiratory effort, no distress  Abdomen: Soft, gravid, nontender  Pelvic: Cervical exam deferred         Extremities: Edema: None  Fetal Status: Fetal Heart Rate (bpm): 137 Fundal Height: 26 cm Movement: Present    Results for orders placed or performed in visit on 08/07/17 (from the past 24 hour(s))  POCT Urinalysis Dipstick   Collection Time: 08/07/17  9:41 AM  Result Value Ref Range   Color, UA     Clarity, UA     Glucose, UA neg    Bilirubin, UA     Ketones, UA neg    Spec Grav, UA  1.010 - 1.025   Blood, UA neg    pH, UA  5.0 - 8.0   Protein, UA neg    Urobilinogen, UA  0.2 or 1.0 E.U./dL   Nitrite, UA neg    Leukocytes, UA Negative Negative    Assessment & Plan:  1) Low-risk pregnancy G1P0 at 2265w6d with an Estimated Date of Delivery: 11/07/17    Labs/procedures today: pn2. Declined flu shot. She was advised that the flu shot is recommended during pregnancy to help protect her and her baby. We discussed that it is an inactivated vaccine-so side effects are minimal, it is considered safe to receive during any trimester, and pregnant women are at a higher risk of developing potential complications from the flu, including death. She was given printed information from the CDC regarding the flu shot and the flu.    Plan:  Continue routine obstetrical care   Reviewed: Preterm labor symptoms and general obstetric precautions including but not limited to vaginal bleeding, contractions, leaking of fluid and fetal movement were reviewed in detail with the patient.  Recommended Tdap at HD/PCP per CDC guidelines. All questions were answered  Follow-up: Return in about 4 weeks (around 09/04/2017) for LROB.  Orders Placed This Encounter  Procedures  . POCT Urinalysis Dipstick   Marge DuncansBooker, Christel Bai Randall CNM, WHNP-BC 08/07/2017 10:00 AM

## 2017-08-07 NOTE — Patient Instructions (Addendum)
Angela FlowersJaquelin R White, I greatly value your feedback.  If you receive a survey following your visit with us today, we appreciate you taking the time to fill it out.  Thanks, Joellyn HaffKim Anjela Cassara, CNM, WHNP-BC   Call the office [phone]) or go to [facility] if:  You begin to have strong, frequent contractions  Your water breaks.  Sometimes it is a big gush of fluid, sometimes it is just a trickle that keeps getting your panties wet or running down your legs  You have vaginal bleeding.  It is normal to have a small amount of spotting if your cervix was checked.   You don't feel your baby moving like normal.  If you don't, get you something to eat and drink and lay down and focus on feeling your baby move.  You should feel at least 10 movements in 2 hours.  If you don't, you should call the office or go to [facility].    Tdap Vaccine  It is recommended that you get the Tdap vaccine during the third trimester of EACH pregnancy to help protect your baby from getting pertussis (whooping cough)  27-36 weeks is the BEST time to do this so that you can pass the protection on to your baby. During pregnancy is better than after pregnancy, but if you are unable to get it during pregnancy it will be offered at the hospital.   You can get this vaccine at the health department or your family doctor  Everyone who will be around your baby should also be up-to-date on their vaccines. Adults (who are not pregnant) only need 1 dose of Tdap during adulthood.   Third Trimester of Pregnancy The third trimester is from week 29 through week 42, months 7 through 9. The third trimester is a time when the fetus is growing rapidly. At the end of the ninth month, the fetus is about 20 inches in length and weighs 6-10 pounds.  BODY CHANGES Your body goes through many changes during pregnancy. The changes vary from woman to woman.   Your weight will continue to increase. You can expect to gain 25-35 pounds (11-16 kg)  by the end of the pregnancy.  You may begin to get stretch marks on your hips, abdomen, and breasts.  You may urinate more often because the fetus is moving lower into your pelvis and pressing on your bladder.  You may develop or continue to have heartburn as a result of your pregnancy.  You may develop constipation because certain hormones are causing the muscles that push waste through your intestines to slow down.  You may develop hemorrhoids or swollen, bulging veins (varicose veins).  You may have pelvic pain because of the weight gain and pregnancy hormones relaxing your joints between the bones in your pelvis. Backaches may result from overexertion of the muscles supporting your posture.  You may have changes in your hair. These can include thickening of your hair, rapid growth, and changes in texture. Some women also have hair loss during or after pregnancy, or hair that feels dry or thin. Your hair will most likely return to normal after your baby is born.  Your breasts will continue to grow and be tender. A yellow discharge may leak from your breasts called colostrum.  Your belly button may stick out.  You may feel short of breath because of your expanding uterus.  You may notice the fetus "dropping," or moving lower in your abdomen.  You may have a bloody  mucus discharge. This usually occurs a few days to a week before labor begins.  Your cervix becomes thin and soft (effaced) near your due date. WHAT TO EXPECT AT YOUR PRENATAL EXAMS  You will have prenatal exams every 2 weeks until week 36. Then, you will have weekly prenatal exams. During a routine prenatal visit:  You will be weighed to make sure you and the fetus are growing normally.  Your blood pressure is taken.  Your abdomen will be measured to track your baby's growth.  The fetal heartbeat will be listened to.  Any test results from the previous visit will be discussed.  You may have a cervical check near  your due date to see if you have effaced. At around 36 weeks, your caregiver will check your cervix. At the same time, your caregiver will also perform a test on the secretions of the vaginal tissue. This test is to determine if a type of bacteria, Group B streptococcus, is present. Your caregiver will explain this further. Your caregiver may ask you:  What your birth plan is.  How you are feeling.  If you are feeling the baby move.  If you have had any abnormal symptoms, such as leaking fluid, bleeding, severe headaches, or abdominal cramping.  If you have any questions. Other tests or screenings that may be performed during your third trimester include:  Blood tests that check for low iron levels (anemia).  Fetal testing to check the health, activity level, and growth of the fetus. Testing is done if you have certain medical conditions or if there are problems during the pregnancy. FALSE LABOR You may feel small, irregular contractions that eventually go away. These are called Braxton Hicks contractions, or false labor. Contractions may last for hours, days, or even weeks before true labor sets in. If contractions come at regular intervals, intensify, or become painful, it is best to be seen by your caregiver.  SIGNS OF LABOR   Menstrual-like cramps.  Contractions that are 5 minutes apart or less.  Contractions that start on the top of the uterus and spread down to the lower abdomen and back.  A sense of increased pelvic pressure or back pain.  A watery or bloody mucus discharge that comes from the vagina. If you have any of these signs before the 37th week of pregnancy, call your caregiver right away. You need to go to the hospital to get checked immediately. HOME CARE INSTRUCTIONS   Avoid all smoking, herbs, alcohol, and unprescribed drugs. These chemicals affect the formation and growth of the baby.  Follow your caregiver's instructions regarding medicine use. There are  medicines that are either safe or unsafe to take during pregnancy.  Exercise only as directed by your caregiver. Experiencing uterine cramps is a good sign to stop exercising.  Continue to eat regular, healthy meals.  Wear a good support bra for breast tenderness.  Do not use hot tubs, steam rooms, or saunas.  Wear your seat belt at all times when driving.  Avoid raw meat, uncooked cheese, cat litter boxes, and soil used by cats. These carry germs that can cause birth defects in the baby.  Take your prenatal vitamins.  Try taking a stool softener (if your caregiver approves) if you develop constipation. Eat more high-fiber foods, such as fresh vegetables or fruit and whole grains. Drink plenty of fluids to keep your urine clear or pale yellow.  Take warm sitz baths to soothe any pain or discomfort caused by hemorrhoids.  Use hemorrhoid cream if your caregiver approves.  If you develop varicose veins, wear support hose. Elevate your feet for 15 minutes, 3-4 times a day. Limit salt in your diet.  Avoid heavy lifting, wear low heal shoes, and practice good posture.  Rest a lot with your legs elevated if you have leg cramps or low back pain.  Visit your dentist if you have not gone during your pregnancy. Use a soft toothbrush to brush your teeth and be gentle when you floss.  A sexual relationship may be continued unless your caregiver directs you otherwise.  Do not travel far distances unless it is absolutely necessary and only with the approval of your caregiver.  Take prenatal classes to understand, practice, and ask questions about the labor and delivery.  Make a trial run to the hospital.  Pack your hospital bag.  Prepare the baby's nursery.  Continue to go to all your prenatal visits as directed by your caregiver. SEEK MEDICAL CARE IF:  You are unsure if you are in labor or if your water has broken.  You have dizziness.  You have mild pelvic cramps, pelvic pressure, or  nagging pain in your abdominal area.  You have persistent nausea, vomiting, or diarrhea.  You have a bad smelling vaginal discharge.  You have pain with urination. SEEK IMMEDIATE MEDICAL CARE IF:   You have a fever.  You are leaking fluid from your vagina.  You have spotting or bleeding from your vagina.  You have severe abdominal cramping or pain.  You have rapid weight loss or gain.  You have shortness of breath with chest pain.  You notice sudden or extreme swelling of your face, hands, ankles, feet, or legs.  You have not felt your baby move in over an hour.  You have severe headaches that do not go away with medicine.  You have vision changes. Document Released: 08/22/2001 Document Revised: 09/02/2013 Document Reviewed: 10/29/2012 [facility] Patient Information 2015 [employer] [place]. This information is not intended to replace advice given to you by your health care provider. Make sure you discuss any questions you have with your health care provider.  PROTECT YOURSELF & YOUR BABY FROM THE FLU! Because you are pregnant, we at [facility], along with the Centers for Disease Control (CDC), recommend that you receive the flu vaccine to protect yourself and your baby from the flu. The flu is more likely to cause severe illness in pregnant women than in women of reproductive age who are not pregnant. Changes in the immune system, heart, and lungs during pregnancy make pregnant women (and women up to two weeks postpartum) more prone to severe illness from flu, including illness resulting in hospitalization. Flu also may be harmful for a pregnant woman's developing baby. A common flu symptom is fever, which may be associated with neural tube defects and other adverse outcomes for a developing baby. Getting vaccinated can also help protect a baby after birth from flu. (Mom passes antibodies onto the developing baby during her pregnancy.)  A Flu Vaccine is the Best Protection Against  Flu Getting a flu vaccine is the first and most important step in protecting against flu. Pregnant women should get a flu shot and not the live attenuated influenza vaccine (LAIV), also known as nasal spray flu vaccine. Flu vaccines given during pregnancy help protect both the mother and her baby from flu. Vaccination has been shown to reduce the risk of flu-associated acute respiratory infection in pregnant women by up to one-half.  A 2018 study showed that getting a flu shot reduced a pregnant woman's risk of being hospitalized with flu by an average of 40 percent. Pregnant women who get a flu vaccine are also helping to protect their babies from flu illness for the first several months after their birth, when they are too young to get vaccinated.   A Long Record of Safety for Flu Shots in Pregnant Women Flu shots have been given to millions of pregnant women over many years with a good safety record. There is a lot of evidence that flu vaccines can be given safely during pregnancy; though these data are limited for the first trimester. The CDC recommends that pregnant women get vaccinated during any trimester of their pregnancy. It is very important for pregnant women to get the flu shot.   Other Preventive Actions In addition to getting a flu shot, pregnant women should take the same everyday preventive actions the CDC recommends of everyone, including covering coughs, washing hands often, and avoiding people who are sick.  Symptoms and Treatment If you get sick with flu symptoms call your doctor right away. There are antiviral drugs that can treat flu illness and prevent serious flu complications. The CDC recommends prompt treatment for people who have influenza infection or suspected influenza infection and who are at high risk of serious flu complications, such as people with asthma, diabetes (including gestational diabetes), or heart disease. Early treatment of influenza in hospitalized pregnant  women has been shown to reduce the length of the hospital stay.  Symptoms Flu symptoms include fever, cough, sore throat, runny or stuffy nose, body aches, headache, chills and fatigue. Some people may also have vomiting and diarrhea. People may be infected with the flu and have respiratory symptoms without a fever.  Early Treatment is Important for Pregnant Women Treatment should begin as soon as possible because antiviral drugs work best when started early (within 48 hours after symptoms start). Antiviral drugs can make your flu illness milder and make you feel better faster. They may also prevent serious health problems that can result from flu illness. Oral oseltamivir (Tamiflu) is the preferred treatment for pregnant women because it has the most studies available to suggest that it is safe and beneficial. Antiviral drugs require a prescription from your provider. Having a fever caused by flu infection or other infections early in pregnancy may be linked to birth defects in a baby. In addition to taking antiviral drugs, pregnant women who get a fever should treat their fever with Tylenol (acetaminophen) and contact their provider immediately.  When to Seek Emergency Medical Care If you are pregnant and have any of these signs, seek care immediately:  Difficulty breathing or shortness of breath  Pain or pressure in the chest or abdomen  Sudden dizziness  Confusion  Severe or persistent vomiting  High fever that is not responding to Tylenol (or store brand equivalent)  Decreased or no movement of your baby  [email or web address]

## 2017-08-08 LAB — GLUCOSE TOLERANCE, 2 HOURS W/ 1HR
GLUCOSE, 1 HOUR: 110 mg/dL (ref 65–179)
GLUCOSE, 2 HOUR: 102 mg/dL (ref 65–152)
Glucose, Fasting: 73 mg/dL (ref 65–91)

## 2017-08-08 LAB — CBC
HEMATOCRIT: 34.9 % (ref 34.0–46.6)
HEMOGLOBIN: 11.9 g/dL (ref 11.1–15.9)
MCH: 29.7 pg (ref 26.6–33.0)
MCHC: 34.1 g/dL (ref 31.5–35.7)
MCV: 87 fL (ref 79–97)
Platelets: 223 10*3/uL (ref 150–379)
RBC: 4.01 x10E6/uL (ref 3.77–5.28)
RDW: 14 % (ref 12.3–15.4)
WBC: 10.3 10*3/uL (ref 3.4–10.8)

## 2017-08-08 LAB — RPR: RPR: NONREACTIVE

## 2017-08-08 LAB — HIV ANTIBODY (ROUTINE TESTING W REFLEX): HIV SCREEN 4TH GENERATION: NONREACTIVE

## 2017-08-08 LAB — ANTIBODY SCREEN: ANTIBODY SCREEN: NEGATIVE

## 2017-08-09 ENCOUNTER — Encounter: Payer: Self-pay | Admitting: Women's Health

## 2017-08-09 ENCOUNTER — Telehealth: Payer: Self-pay | Admitting: Obstetrics & Gynecology

## 2017-08-09 NOTE — Telephone Encounter (Signed)
Attempted to call patient but phone is turned off, goes straight to voicemail x2. Will send mychart message to patient to see if I can reach her that way.

## 2017-09-05 ENCOUNTER — Encounter: Payer: Self-pay | Admitting: Women's Health

## 2017-09-05 ENCOUNTER — Ambulatory Visit (INDEPENDENT_AMBULATORY_CARE_PROVIDER_SITE_OTHER): Payer: BLUE CROSS/BLUE SHIELD | Admitting: Women's Health

## 2017-09-05 VITALS — BP 92/58 | HR 88 | Wt 135.0 lb

## 2017-09-05 DIAGNOSIS — Z3A31 31 weeks gestation of pregnancy: Secondary | ICD-10-CM

## 2017-09-05 DIAGNOSIS — Z331 Pregnant state, incidental: Secondary | ICD-10-CM

## 2017-09-05 DIAGNOSIS — Z3403 Encounter for supervision of normal first pregnancy, third trimester: Secondary | ICD-10-CM

## 2017-09-05 DIAGNOSIS — Z1389 Encounter for screening for other disorder: Secondary | ICD-10-CM

## 2017-09-05 LAB — POCT URINALYSIS DIPSTICK
GLUCOSE UA: NEGATIVE
Nitrite, UA: NEGATIVE
Protein, UA: NEGATIVE
RBC UA: NEGATIVE

## 2017-09-05 NOTE — Progress Notes (Signed)
   LOW-RISK PREGNANCY VISIT Patient name: Angela White Marcoux MRN [id number]  Date of birth: December 25, 1995 Chief Complaint:   Routine Prenatal Visit (back pain)  History of Present Illness:   Angela White Brecht is a 21 y.o. G1P0 female at 5012w0d with an Estimated Date of Delivery: 11/07/17 being seen today for ongoing management of a low-risk pregnancy.  Today she reports low back pain. Contractions: Not present. Vag. Bleeding: None.  Movement: Present. denies leaking of fluid. Review of Systems:   Pertinent items are noted in HPI Denies abnormal vaginal discharge w/ itching/odor/irritation, headaches, visual changes, shortness of breath, chest pain, abdominal pain, severe nausea/vomiting, or problems with urination or bowel movements unless otherwise stated above. Pertinent History Reviewed:  Reviewed past medical,surgical, social, obstetrical and family history.  Reviewed problem list, medications and allergies. Physical Assessment:   Vitals:   09/05/17 1044  BP: (!) 92/58  Pulse: 88  Weight: 135 lb (61.2 kg)  Body mass index is 26.37 kg/m.        Physical Examination:   General appearance: Well appearing, and in no distress  Mental status: Alert, oriented to person, place, and time  Skin: Warm & dry  Cardiovascular: Normal heart rate noted  Respiratory: Normal respiratory effort, no distress  Abdomen: Soft, gravid, nontender  Pelvic: Cervical exam deferred         Extremities: Edema: Trace  Fetal Status: Fetal Heart Rate (bpm): 130 Fundal Height: 30 cm Movement: Present    Results for orders placed or performed in visit on 09/05/17 (from the past 24 hour(s))  POCT urinalysis dipstick   Collection Time: 09/05/17 10:49 AM  Result Value Ref Range   Color, UA     Clarity, UA     Glucose, UA neg    Bilirubin, UA     Ketones, UA small    Spec Grav, UA  1.010 - 1.025   Blood, UA neg    pH, UA  5.0 - 8.0   Protein, UA neg    Urobilinogen, UA  0.2 or 1.0 E.U./dL   Nitrite, UA  neg    Leukocytes, UA Small (1+) (A) Negative   Appearance     Odor      Assessment & Plan:  1) Low-risk pregnancy G1P0 at 5412w0d with an Estimated Date of Delivery: 11/07/17   2) Low back pain, gave printed prevention/relief measures    Labs/procedures today: none  Plan:  Continue routine obstetrical care   Reviewed: Preterm labor symptoms and general obstetric precautions including but not limited to vaginal bleeding, contractions, leaking of fluid and fetal movement were reviewed in detail with the patient. Recommended Tdap at HD/PCP per CDC guidelines.  All questions were answered  Follow-up: Return in about 2 weeks (around 09/19/2017) for LROB.  Orders Placed This Encounter  Procedures  . POCT urinalysis dipstick   Marge DuncansBooker, Kareem Cathey Randall CNM, [facility] 09/05/2017 11:09 AM

## 2017-09-05 NOTE — Patient Instructions (Addendum)
Angela White, I greatly value your feedback.  If you receive a survey following your visit with [place] today, we appreciate you taking the time to fill it out.  Thanks, Angela White, CNM, WHNP-BC   Call the office [phone]) or go to Angela White if:  You begin to have strong, frequent contractions  Your water breaks.  Sometimes it is a big gush of fluid, sometimes it is just a trickle that keeps getting your panties wet or running down your legs  You have vaginal bleeding.  It is normal to have a small amount of spotting if your cervix was checked.   You don't feel your baby moving like normal.  If you don't, get you something to eat and drink and lay down and focus on feeling your baby move.  You should feel at least 10 movements in 2 hours.  If you don't, you should call the office or go to [facility].    Tdap Vaccine  It is recommended that you get the Tdap vaccine during the third trimester of EACH pregnancy to help protect your baby from getting pertussis (whooping cough)  27-36 weeks is the BEST time to do this so that you can pass the protection on to your baby. During pregnancy is better than after pregnancy, but if you are unable to get it during pregnancy it will be offered at the [facility].   You can get this vaccine at the health department or your family doctor  Everyone who will be around your baby should also be up-to-date on their vaccines. Adults (who are not pregnant) only need 1 dose of Tdap during adulthood.   For your lower back pain you may:  Purchase a pregnancy belt from [employer] [place], [employer], [employer], etc and wear it while you are up and about  Take warm baths  Use a heating pad to your lower back for no longer than 20 minutes at a time, and do not place near abdomen  Take tylenol as needed. Please follow directions on the bottle  Kinesthesiology tape (can get from sporting goods store), google how to tape belly for pregnancy     Third Trimester of Pregnancy The third trimester is from week 29 through week 42, months 7 through 9. The third trimester is a time when the fetus is growing rapidly. At the end of the ninth month, the fetus is about 20 inches in length and weighs 6-10 pounds.  BODY CHANGES Your body goes through many changes during pregnancy. The changes vary from woman to woman.   Your weight will continue to increase. You can expect to gain 25-35 pounds (11-16 kg) by the end of the pregnancy.  You may begin to get stretch marks on your hips, abdomen, and breasts.  You may urinate more often because the fetus is moving lower into your pelvis and pressing on your bladder.  You may develop or continue to have heartburn as a result of your pregnancy.  You may develop constipation because certain hormones are causing the muscles that push waste through your intestines to slow down.  You may develop hemorrhoids or swollen, bulging veins (varicose veins).  You may have pelvic pain because of the weight gain and pregnancy hormones relaxing your joints between the bones in your pelvis. Backaches may result from overexertion of the muscles supporting your posture.  You may have changes in your hair. These can include thickening of your hair, rapid growth, and changes in texture. Some  women also have hair loss during or after pregnancy, or hair that feels dry or thin. Your hair will most likely return to normal after your baby is born.  Your breasts will continue to grow and be tender. A yellow discharge may leak from your breasts called colostrum.  Your belly button may stick out.  You may feel short of breath because of your expanding uterus.  You may notice the fetus "dropping," or moving lower in your abdomen.  You may have a bloody mucus discharge. This usually occurs a few days to a week before labor begins.  Your cervix becomes thin and soft (effaced) near your due date. WHAT TO EXPECT AT YOUR  PRENATAL EXAMS  You will have prenatal exams every 2 weeks until week 36. Then, you will have weekly prenatal exams. During a routine prenatal visit:  You will be weighed to make sure you and the fetus are growing normally.  Your blood pressure is taken.  Your abdomen will be measured to track your baby's growth.  The fetal heartbeat will be listened to.  Any test results from the previous visit will be discussed.  You may have a cervical check near your due date to see if you have effaced. At around 36 weeks, your caregiver will check your cervix. At the same time, your caregiver will also perform a test on the secretions of the vaginal tissue. This test is to determine if a type of bacteria, Group B streptococcus, is present. Your caregiver will explain this further. Your caregiver may ask you:  What your birth plan is.  How you are feeling.  If you are feeling the baby move.  If you have had any abnormal symptoms, such as leaking fluid, bleeding, severe headaches, or abdominal cramping.  If you have any questions. Other tests or screenings that may be performed during your third trimester include:  Blood tests that check for low iron levels (anemia).  Fetal testing to check the health, activity level, and growth of the fetus. Testing is done if you have certain medical conditions or if there are problems during the pregnancy. FALSE LABOR You may feel small, irregular contractions that eventually go away. These are called Braxton Hicks contractions, or false labor. Contractions may last for hours, days, or even weeks before true labor sets in. If contractions come at regular intervals, intensify, or become painful, it is best to be seen by your caregiver.  SIGNS OF LABOR   Menstrual-like cramps.  Contractions that are 5 minutes apart or less.  Contractions that start on the top of the uterus and spread down to the lower abdomen and back.  A sense of increased pelvic  pressure or back pain.  A watery or bloody mucus discharge that comes from the vagina. If you have any of these signs before the 37th week of pregnancy, call your caregiver right away. You need to go to the [facility] to get checked immediately. HOME CARE INSTRUCTIONS   Avoid all smoking, herbs, alcohol, and unprescribed drugs. These chemicals affect the formation and growth of the baby.  Follow your caregiver's instructions regarding medicine use. There are medicines that are either safe or unsafe to take during pregnancy.  Exercise only as directed by your caregiver. Experiencing uterine cramps is a good sign to stop exercising.  Continue to eat regular, healthy meals.  Wear a good support bra for breast tenderness.  Do not use hot tubs, steam rooms, or saunas.  Wear your seat belt at  all times when driving.  Avoid raw meat, uncooked cheese, cat litter boxes, and soil used by cats. These carry germs that can cause birth defects in the baby.  Take your prenatal vitamins.  Try taking a stool softener (if your caregiver approves) if you develop constipation. Eat more high-fiber foods, such as fresh vegetables or fruit and whole grains. Drink plenty of fluids to keep your urine clear or pale yellow.  Take warm sitz baths to soothe any pain or discomfort caused by hemorrhoids. Use hemorrhoid cream if your caregiver approves.  If you develop varicose veins, wear support hose. Elevate your feet for 15 minutes, 3-4 times a day. Limit salt in your diet.  Avoid heavy lifting, wear low heal shoes, and practice good posture.  Rest a lot with your legs elevated if you have leg cramps or low back pain.  Visit your dentist if you have not gone during your pregnancy. Use a soft toothbrush to brush your teeth and be gentle when you floss.  A sexual relationship may be continued unless your caregiver directs you otherwise.  Do not travel far distances unless it is absolutely necessary and only  with the approval of your caregiver.  Take prenatal classes to understand, practice, and ask questions about the labor and delivery.  Make a trial run to the [facility].  Pack your [facility] bag.  Prepare the baby's nursery.  Continue to go to all your prenatal visits as directed by your caregiver. SEEK MEDICAL CARE IF:  You are unsure if you are in labor or if your water has broken.  You have dizziness.  You have mild pelvic cramps, pelvic pressure, or nagging pain in your abdominal area.  You have persistent nausea, vomiting, or diarrhea.  You have a bad smelling vaginal discharge.  You have pain with urination. SEEK IMMEDIATE MEDICAL CARE IF:   You have a fever.  You are leaking fluid from your vagina.  You have spotting or bleeding from your vagina.  You have severe abdominal cramping or pain.  You have rapid weight loss or gain.  You have shortness of breath with chest pain.  You notice sudden or extreme swelling of your face, hands, ankles, feet, or legs.  You have not felt your baby move in over an hour.  You have severe headaches that do not go away with medicine.  You have vision changes. Document Released: 08/22/2001 Document Revised: 09/02/2013 Document Reviewed: 10/29/2012 [facility] [employer] Patient Information 2015 [employer] [place]. This information is not intended to replace advice given to you by your health care provider. Make sure you discuss any questions you have with your health care provider.

## 2017-09-06 ENCOUNTER — Telehealth: Payer: Self-pay | Admitting: *Deleted

## 2017-09-06 ENCOUNTER — Ambulatory Visit (INDEPENDENT_AMBULATORY_CARE_PROVIDER_SITE_OTHER): Payer: BLUE CROSS/BLUE SHIELD | Admitting: Obstetrics and Gynecology

## 2017-09-06 ENCOUNTER — Encounter: Payer: Self-pay | Admitting: Obstetrics and Gynecology

## 2017-09-06 VITALS — BP 94/58 | HR 90 | Wt 136.4 lb

## 2017-09-06 DIAGNOSIS — Z3403 Encounter for supervision of normal first pregnancy, third trimester: Secondary | ICD-10-CM

## 2017-09-06 DIAGNOSIS — Z331 Pregnant state, incidental: Secondary | ICD-10-CM

## 2017-09-06 DIAGNOSIS — Z3A31 31 weeks gestation of pregnancy: Secondary | ICD-10-CM

## 2017-09-06 DIAGNOSIS — S3991XA Unspecified injury of abdomen, initial encounter: Secondary | ICD-10-CM

## 2017-09-06 DIAGNOSIS — Z1389 Encounter for screening for other disorder: Secondary | ICD-10-CM | POA: Diagnosis not present

## 2017-09-06 DIAGNOSIS — O9A213 Injury, poisoning and certain other consequences of external causes complicating pregnancy, third trimester: Secondary | ICD-10-CM

## 2017-09-06 LAB — POCT URINALYSIS DIPSTICK
Glucose, UA: NEGATIVE
Ketones, UA: NEGATIVE
LEUKOCYTES UA: NEGATIVE
NITRITE UA: NEGATIVE
PROTEIN UA: NEGATIVE
RBC UA: NEGATIVE
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
## 2017-09-10 ENCOUNTER — Encounter: Payer: Self-pay | Admitting: *Deleted

## 2017-09-10 ENCOUNTER — Telehealth: Payer: Self-pay | Admitting: Obstetrics and Gynecology

## 2017-09-11 NOTE — L&D Delivery Note (Signed)
Delivery Note At 4:15 PM a viable female was delivered via Vaginal, Spontaneous (Presentation: direct OA).  APGAR: 8, 9; weight pending.   Placenta status: spontaneous, gentle traction; ?abnl vascularities, sent to path.  Cord: 3v  with the following complications: none.   Anesthesia:   Episiotomy: None Lacerations:  1st degree L labial  Suture Repair: 4.0 vicryl SH Est. Blood Loss (mL):  50cc  Mom to postpartum.  Baby to Couplet care / Skin to Skin.  Loni MuseKate Timberlake 10/27/2017, 4:49 PM   I attest that I was gloved and present for the delivery and repair, and I agree with the above documentation and findings.   Luna KitchensKathryn Keyosha Tiedt CNM  Please schedule this patient for Postpartum visit in: 6 weeks with the following provider: Any provider For C/S patients schedule nurse incision check in weeks 2 weeks: no Low risk pregnancy complicated by: none.  Delivery mode:  SVD Anticipated Birth Control:  Nexplanon PP Procedures needed: None  Schedule Integrated BH visit: no

## 2017-09-19 ENCOUNTER — Encounter: Payer: Self-pay | Admitting: Women's Health

## 2017-09-19 ENCOUNTER — Ambulatory Visit (INDEPENDENT_AMBULATORY_CARE_PROVIDER_SITE_OTHER): Payer: BLUE CROSS/BLUE SHIELD | Admitting: Women's Health

## 2017-09-19 VITALS — BP 100/60 | HR 98 | Wt 139.0 lb

## 2017-09-19 DIAGNOSIS — Z331 Pregnant state, incidental: Secondary | ICD-10-CM

## 2017-09-19 DIAGNOSIS — Z1389 Encounter for screening for other disorder: Secondary | ICD-10-CM

## 2017-09-19 DIAGNOSIS — Z3A33 33 weeks gestation of pregnancy: Secondary | ICD-10-CM

## 2017-09-19 DIAGNOSIS — Z3403 Encounter for supervision of normal first pregnancy, third trimester: Secondary | ICD-10-CM

## 2017-09-19 LAB — POCT URINALYSIS DIPSTICK
Blood, UA: NEGATIVE
GLUCOSE UA: NEGATIVE
Ketones, UA: NEGATIVE
Leukocytes, UA: NEGATIVE
Nitrite, UA: NEGATIVE

## 2017-09-19 NOTE — Progress Notes (Signed)
   LOW-RISK PREGNANCY VISIT Patient name: Angela White MRN [id number]  Date of birth: March 08, 1996 Chief Complaint:   No chief complaint on file.  History of Present Illness:   Angela FlowersJaquelin R Decker is a 22 y.o. G1P0 female at 508w0d with an Estimated Date of Delivery: 11/07/17 being seen today for ongoing management of a low-risk pregnancy.  Today she reports no complaints. Contractions: Not present.  .  Movement: Present. denies leaking of fluid. Review of Systems:   Pertinent items are noted in HPI Denies abnormal vaginal discharge w/ itching/odor/irritation, headaches, visual changes, shortness of breath, chest pain, abdominal pain, severe nausea/vomiting, or problems with urination or bowel movements unless otherwise stated above. Pertinent History Reviewed:  Reviewed past medical,surgical, social, obstetrical and family history.  Reviewed problem list, medications and allergies. Physical Assessment:   Vitals:   09/19/17 1422  BP: 100/60  Pulse: 98  Weight: 139 lb (63 kg)  Body mass index is 27.15 kg/m.        Physical Examination:   General appearance: Well appearing, and in no distress  Mental status: Alert, oriented to person, place, and time  Skin: Warm & dry  Cardiovascular: Normal heart rate noted  Respiratory: Normal respiratory effort, no distress  Abdomen: Soft, gravid, nontender  Pelvic: Cervical exam deferred         Extremities: Edema: None  Fetal Status: Fetal Heart Rate (bpm): 135 Fundal Height: 32 cm Movement: Present    Results for orders placed or performed in visit on 09/19/17 (from the past 24 hour(s))  POCT urinalysis dipstick   Collection Time: 09/19/17  2:29 PM  Result Value Ref Range   Color, UA     Clarity, UA     Glucose, UA neg    Bilirubin, UA     Ketones, UA neg    Spec Grav, UA  1.010 - 1.025   Blood, UA neg    pH, UA  5.0 - 8.0   Protein, UA trace    Urobilinogen, UA  0.2 or 1.0 E.U./dL   Nitrite, UA neg    Leukocytes, UA Negative  Negative   Appearance     Odor      Assessment & Plan:  1) Low-risk pregnancy G1P0 at 708w0d with an Estimated Date of Delivery: 11/07/17    Meds: No orders of the defined types were placed in this encounter.  Labs/procedures today: none  Plan:  Continue routine obstetrical care   Reviewed: Preterm labor symptoms and general obstetric precautions including but not limited to vaginal bleeding, contractions, leaking of fluid and fetal movement were reviewed in detail with the patient.  All questions were answered  Follow-up: Return in about 2 weeks (around 10/03/2017) for LROB.  Orders Placed This Encounter  Procedures  . POCT urinalysis dipstick   Marge DuncansBooker, Kimberly Randall CNM, [facility] Center[facility]NP-BC 09/19/2017 2:41 PM

## 2017-09-19 NOTE — Patient Instructions (Signed)
Angela FlowersJaquelin R White, I greatly value your feedback.  If you receive a survey following your visit with us today, we appreciate you taking the time to fill it out.  Thanks, Angela HaffKim Maurina White, CNM, WHNP-BC   Call the office [phone]) or go to [facility] if:  You begin to have strong, frequent contractions  Your water breaks.  Sometimes it is a big gush of fluid, sometimes it is just a trickle that keeps getting your panties wet or running down your legs  You have vaginal bleeding.  It is normal to have a small amount of spotting if your cervix was checked.   You don't feel your baby moving like normal.  If you don't, get you something to eat and drink and lay down and focus on feeling your baby move.  You should feel at least 10 movements in 2 hours.  If you don't, you should call the office or go to [facility].     Preterm Labor and Birth Information The normal length of a pregnancy is 39-41 weeks. Preterm labor is when labor starts before 37 completed weeks of pregnancy. What are the risk factors for preterm labor? Preterm labor is more likely to occur in women who:  Have certain infections during pregnancy such as a bladder infection, sexually transmitted infection, or infection inside the uterus (chorioamnionitis).  Have a shorter-than-normal cervix.  Have gone into preterm labor before.  Have had surgery on their cervix.  Are younger than age 21 or older than age 21.  Are African American.  Are pregnant with twins or multiple babies (multiple gestation).  Take street drugs or smoke while pregnant.  Do not gain enough weight while pregnant.  Became pregnant shortly after having been pregnant.  What are the symptoms of preterm labor? Symptoms of preterm labor include:  Cramps similar to those that can happen during a menstrual period. The cramps may happen with diarrhea.  Pain in the abdomen or lower back.  Regular uterine contractions that may feel like  tightening of the abdomen.  A feeling of increased pressure in the pelvis.  Increased watery or bloody mucus discharge from the vagina.  Water breaking (ruptured amniotic sac).  Why is it important to recognize signs of preterm labor? It is important to recognize signs of preterm labor because babies who are born prematurely may not be fully developed. This can put them at an increased risk for:  Long-term (chronic) heart and lung problems.  Difficulty immediately after birth with regulating body systems, including blood sugar, body temperature, heart rate, and breathing rate.  Bleeding in the brain.  Cerebral palsy.  Learning difficulties.  Death.  These risks are highest for babies who are born before 34 weeks of pregnancy. How is preterm labor treated? Treatment depends on the length of your pregnancy, your condition, and the health of your baby. It may involve:  Having a stitch (suture) placed in your cervix to prevent your cervix from opening too early (cerclage).  Taking or being given medicines, such as: ? Hormone medicines. These may be given early in pregnancy to help support the pregnancy. ? Medicine to stop contractions. ? Medicines to help mature the baby's lungs. These may be prescribed if the risk of delivery is high. ? Medicines to prevent your baby from developing cerebral palsy.  If the labor happens before 34 weeks of pregnancy, you may need to stay in the hospital. What should I do if I think I am in preterm labor? If  you think that you are going into preterm labor, call your health care provider right away. How can I prevent preterm labor in future pregnancies? To increase your chance of having a full-term pregnancy:  Do not use any tobacco products, such as cigarettes, chewing tobacco, and e-cigarettes. If you need help quitting, ask your health care provider.  Do not use street drugs or medicines that have not been prescribed to you during your  pregnancy.  Talk with your health care provider before taking any herbal supplements, even if you have been taking them regularly.  Make sure you gain a healthy amount of weight during your pregnancy.  Watch for infection. If you think that you might have an infection, get it checked right away.  Make sure to tell your health care provider if you have gone into preterm labor before.  This information is not intended to replace advice given to you by your health care provider. Make sure you discuss any questions you have with your health care provider. Document Released: 11/18/2003 Document Revised: 02/08/2016 Document Reviewed: 01/19/2016 Elsevier Interactive Patient Education  2018 [employer].

## 2017-09-20 ENCOUNTER — Encounter: Payer: BLUE CROSS/BLUE SHIELD | Admitting: Advanced Practice Midwife

## 2017-10-03 ENCOUNTER — Encounter: Payer: BLUE CROSS/BLUE SHIELD | Admitting: Women's Health

## 2017-10-04 ENCOUNTER — Ambulatory Visit (INDEPENDENT_AMBULATORY_CARE_PROVIDER_SITE_OTHER): Payer: BLUE CROSS/BLUE SHIELD | Admitting: Women's Health

## 2017-10-04 ENCOUNTER — Other Ambulatory Visit: Payer: Self-pay | Admitting: Women's Health

## 2017-10-04 ENCOUNTER — Encounter: Payer: Self-pay | Admitting: Women's Health

## 2017-10-04 VITALS — BP 98/60 | HR 100 | Wt 139.0 lb

## 2017-10-04 DIAGNOSIS — A599 Trichomoniasis, unspecified: Secondary | ICD-10-CM

## 2017-10-04 DIAGNOSIS — Z3A35 35 weeks gestation of pregnancy: Secondary | ICD-10-CM

## 2017-10-04 DIAGNOSIS — N898 Other specified noninflammatory disorders of vagina: Secondary | ICD-10-CM

## 2017-10-04 DIAGNOSIS — Z3403 Encounter for supervision of normal first pregnancy, third trimester: Secondary | ICD-10-CM

## 2017-10-04 DIAGNOSIS — Z1389 Encounter for screening for other disorder: Secondary | ICD-10-CM

## 2017-10-04 DIAGNOSIS — Z331 Pregnant state, incidental: Secondary | ICD-10-CM

## 2017-10-04 DIAGNOSIS — O26843 Uterine size-date discrepancy, third trimester: Secondary | ICD-10-CM

## 2017-10-04 DIAGNOSIS — O26893 Other specified pregnancy related conditions, third trimester: Secondary | ICD-10-CM

## 2017-10-04 LAB — POCT URINALYSIS DIPSTICK
Glucose, UA: NEGATIVE
KETONES UA: NEGATIVE
NITRITE UA: NEGATIVE
PROTEIN UA: NEGATIVE

## 2017-10-04 MED ORDER — METRONIDAZOLE 500 MG PO TABS: 500.0000 mg | ORAL_TABLET | Freq: Two times a day (BID) | ORAL | 0 refills | Status: DC

## 2017-10-04 NOTE — Progress Notes (Signed)
Work-in LOW-RISK PREGNANCY VISIT Patient name: Angela White MRN [id number]  Date of birth: [birth year] Chief Complaint:   work in ob (leaking fluid)  History of Present Illness:   Angela White is a 22 y.o. G1P0 female at 9371w1d with an Estimated Date of Delivery: 11/07/17 being seen today for ongoing management of a low-risk pregnancy.  Today she reports leaking fluid intermittently x 2 weeks, however last night at 0200 was a bigger gush, got underwear wet and small spot on bed when she turned over. . Denies abnormal discharge, itching/odor/irritation.  Denies uti s/s.  Contractions: Not present. Vag. Bleeding: None.  Movement: Present. denies leaking of fluid. Review of Systems:   Pertinent items are noted in HPI Denies abnormal vaginal discharge w/ itching/odor/irritation, headaches, visual changes, shortness of breath, chest pain, abdominal pain, severe nausea/vomiting, or problems with urination or bowel movements unless otherwise stated above. Pertinent History Reviewed:  Reviewed past medical,surgical, social, obstetrical and family history.  Reviewed problem list, medications and allergies. Physical Assessment:   Vitals:   10/04/17 1148  BP: 98/60  Pulse: 100  Weight: 139 lb (63 kg)  Body mass index is 27.15 kg/m.        Physical Examination:   General appearance: Well appearing, and in no distress  Mental status: Alert, oriented to person, place, and time  Skin: Warm & dry  Cardiovascular: Normal heart rate noted  Respiratory: Normal respiratory effort, no distress  Abdomen: Soft, gravid, nontender  Pelvic: spec exam: cx visually closed, no pooling, no change w/ valsalva, mod amt thin yellow frothy malodorous d/c. Fern & nitrazine neg         Extremities: Edema: Trace  Fetal Status: Fetal Heart Rate (bpm): 138 Fundal Height: 32 cm Movement: Present    Results for orders placed or performed in visit on 10/04/17 (from the past 24 hour(s))  POCT urinalysis  dipstick   Collection Time: 10/04/17 11:49 AM  Result Value Ref Range   Color, UA     Clarity, UA     Glucose, UA neg    Bilirubin, UA     Ketones, UA neg    Spec Grav, UA  1.010 - 1.025   Blood, UA trace    pH, UA  5.0 - 8.0   Protein, UA neg    Urobilinogen, UA  0.2 or 1.0 E.U./dL   Nitrite, UA neg    Leukocytes, UA Moderate (2+) (A) Negative   Appearance     Odor      Assessment & Plan:  1) Low-risk pregnancy G1P0 at 5671w1d with an Estimated Date of Delivery: 11/07/17   2) Trichomonas, rx metronidazole 500mg  BID x 7d, no sex while taking, will let me know if she wants me to tx partner- if not he needs to go to HD or PCP for tx. No sex x at least 7d from both finishing meds, plan POC in 3-4wks.   3) Leaking> this is likely the d/c from trichomonas. Pooling, fern and nitrazine all neg. Discussed s/s srom, reasons to seek care  4) Uterine size <dates, do not have u/s available today for tomorrow, will check efw/afi monday   Meds:  Meds ordered this encounter  Medications  . metroNIDAZOLE (FLAGYL) 500 MG tablet    Sig: Take 1 tablet (500 mg total) by mouth 2 (two) times daily.    Dispense:  14 tablet    Refill:  0    Order Specific Question:   Supervising Provider  Answer:   Duane Lope H [[id number]]   Labs/procedures today: wet prep, fern, nitrazine, gc/ct  Plan:  Continue routine obstetrical care   Reviewed: Preterm labor symptoms and general obstetric precautions including but not limited to vaginal bleeding, contractions, leaking of fluid and fetal movement were reviewed in detail with the patient.  All questions were answered  Follow-up: Return for cancel tomorrow's appt, f/u monday for LROB and efw/afi u/s.  Orders Placed This Encounter  Procedures  . GC/Chlamydia Probe Amp  . US OB Follow Up  . POCT urinalysis dipstick  . POCT [employer] ([address]   Marge Duncans CNM, [facility] 10/04/2017 12:20 PM

## 2017-10-04 NOTE — Patient Instructions (Signed)
Angela White, I greatly value your feedback.  If you receive a survey following your visit with [place] today, we appreciate you taking the time to fill it out.  Thanks, [name], CNM, WHNP-BC   Call the office [phone]) or go to [facility] if:  You begin to have strong, frequent contractions  Your water breaks.  Sometimes it is a big gush of fluid, sometimes it is just a trickle that keeps getting your panties wet or running down your legs  You have vaginal bleeding.  It is normal to have a small amount of spotting if your cervix was checked.   You don't feel your baby moving like normal.  If you don't, get you something to eat and drink and lay down and focus on feeling your baby move.  You should feel at least 10 movements in 2 hours.  If you don't, you should call the office or go to [facility].     Trichomoniasis Trichomoniasis is an STI (sexually transmitted infection) that can affect both women and men. In women, the outer area of the female genitalia (vulva) and the vagina are affected. In men, the penis is mainly affected, but the prostate and other reproductive organs can also be involved. This condition can be treated with medicine. It often has no symptoms (is asymptomatic), especially in men. What are the causes? This condition is caused by an organism called Trichomonas vaginalis. Trichomoniasis most often spreads from person to person (is contagious) through sexual contact. What increases the risk? The following factors may make you more likely to develop this condition:  Having unprotected sexual intercourse.  Having sexual intercourse with a partner who has trichomoniasis.  Having multiple sexual partners.  Having had previous trichomoniasis infections or other STIs.  What are the signs or symptoms? In women, symptoms of trichomoniasis include:  Abnormal vaginal discharge that is clear, white, gray, or yellow-green and foamy and has an unusual  "fishy" odor.  Itching and irritation of the vagina and vulva.  Burning or pain during urination or sexual intercourse.  Genital redness and swelling.  In men, symptoms of trichomoniasis include:  Penile discharge that may be foamy or contain pus.  Pain in the penis. This may happen only when urinating.  Itching or irritation inside the penis.  Burning after urination or ejaculation.  How is this diagnosed? In women, this condition may be found during a routine Pap test or physical exam. It may be found in men during a routine physical exam. Your health care provider may perform tests to help diagnose this infection, such as:  Urine tests (men and women).  The following in women: ? Testing the pH of the vagina. ? A vaginal swab test that checks for the Trichomonas vaginalis organism. ? Testing vaginal secretions.  Your health care provider may test you for other STIs, including HIV (human immunodeficiency virus). How is this treated? This condition is treated with medicine taken by mouth (orally), such as metronidazole or tinidazole to fight the infection. Your sexual partner(s) may also need to be tested and treated.  If you are a woman and you plan to become pregnant or think you may be pregnant, tell your health care provider right away. Some medicines that are used to treat the infection should not be taken during pregnancy.  Your health care provider may recommend over-the-counter medicines or creams to help relieve itching or irritation. You may be tested for infection again 3 months after treatment. Follow these  instructions at home:  Take and use over-the-counter and prescription medicines, including creams, only as told by your health care provider.  Do not have sexual intercourse until one week after you finish your medicine, or until your health care provider approves. Ask your health care provider when you may resume sexual intercourse.  (Women) Do not douche or  wear tampons while you have the infection.  Discuss your infection with your sexual partner(s). Make sure that your partner gets tested and treated, if necessary.  Keep all follow-up visits as told by your health care provider. This is important. How is this prevented?  Use condoms every time you have sex. Using condoms correctly and consistently can help protect against STIs.  Avoid having multiple sexual partners.  Talk with your sexual partner about any symptoms that either of you may have, as well as any history of STIs.  Get tested for STIs and STDs (sexually transmitted diseases) before you have sex. Ask your partner to do the same.  Do not have sexual contact if you have symptoms of trichomoniasis or another STI. Contact a health care provider if:  You still have symptoms after you finish your medicine.  You develop pain in your abdomen.  You have pain when you urinate.  You have bleeding after sexual intercourse.  You develop a rash.  You feel nauseous or you vomit.  You plan to become pregnant or think you may be pregnant. Summary  Trichomoniasis is an STI (sexually transmitted infection) that can affect both women and men.  This condition often has no symptoms (is asymptomatic), especially in men.  You should not have sexual intercourse until one week after you finish your medicine, or until your health care provider approves. Ask your health care provider when you may resume sexual intercourse.  Discuss your infection with your sexual partner. Make sure that your partner gets tested and treated, if necessary. This information is not intended to replace advice given to you by your health care provider. Make sure you discuss any questions you have with your health care provider. Document Released: 02/21/2001 Document Revised: 07/21/2016 Document Reviewed: 07/21/2016 Elsevier Interactive Patient Education  2017 [employer]

## 2017-10-05 ENCOUNTER — Encounter: Payer: BLUE CROSS/BLUE SHIELD | Admitting: Women's Health

## 2017-10-05 LAB — GC/CHLAMYDIA PROBE AMP
Chlamydia trachomatis, NAA: NEGATIVE
Neisseria gonorrhoeae by PCR: NEGATIVE

## 2017-10-08 ENCOUNTER — Ambulatory Visit (INDEPENDENT_AMBULATORY_CARE_PROVIDER_SITE_OTHER): Payer: BLUE CROSS/BLUE SHIELD | Admitting: Obstetrics & Gynecology

## 2017-10-08 ENCOUNTER — Encounter: Payer: Self-pay | Admitting: Obstetrics & Gynecology

## 2017-10-08 ENCOUNTER — Ambulatory Visit (INDEPENDENT_AMBULATORY_CARE_PROVIDER_SITE_OTHER): Payer: BLUE CROSS/BLUE SHIELD

## 2017-10-08 VITALS — BP 120/80 | HR 87 | Wt 140.5 lb

## 2017-10-08 DIAGNOSIS — O26843 Uterine size-date discrepancy, third trimester: Secondary | ICD-10-CM | POA: Diagnosis not present

## 2017-10-08 DIAGNOSIS — Z3A35 35 weeks gestation of pregnancy: Secondary | ICD-10-CM

## 2017-10-08 DIAGNOSIS — Z3402 Encounter for supervision of normal first pregnancy, second trimester: Secondary | ICD-10-CM

## 2017-10-08 DIAGNOSIS — Z331 Pregnant state, incidental: Secondary | ICD-10-CM

## 2017-10-08 DIAGNOSIS — Z1389 Encounter for screening for other disorder: Secondary | ICD-10-CM

## 2017-10-08 DIAGNOSIS — Z3403 Encounter for supervision of normal first pregnancy, third trimester: Secondary | ICD-10-CM

## 2017-10-08 LAB — POCT URINALYSIS DIPSTICK
Blood, UA: NEGATIVE
Glucose, UA: NEGATIVE
KETONES UA: NEGATIVE
Nitrite, UA: NEGATIVE
Protein, UA: NEGATIVE

## 2017-10-08 NOTE — Progress Notes (Signed)
US 35+5 wks,cephalic,posterior pl gr 3,fhr 141 bpm,afi 7 cm,efw 2586 g 34 %,FL 4%

## 2017-10-08 NOTE — Progress Notes (Signed)
G1P0 5776w5d Estimated Date of Delivery: 11/07/17  Blood pressure 120/80, pulse 87, weight 140 lb 8 oz (63.7 kg).   BP weight and urine results all reviewed and noted.  Please refer to the obstetrical flow sheet for the fundal height and fetal heart rate documentation:  Patient reports good fetal movement, denies any bleeding and no rupture of membranes symptoms or regular contractions. Patient is without complaints. All questions were answered.  Orders Placed This Encounter  Procedures  . POCT urinalysis dipstick    Plan:  Continued routine obstetrical care, EFW 34% with good fluid  Return in about 1 week (around 10/15/2017) for LROB.

## 2017-10-15 ENCOUNTER — Ambulatory Visit (INDEPENDENT_AMBULATORY_CARE_PROVIDER_SITE_OTHER): Payer: BLUE CROSS/BLUE SHIELD | Admitting: Women's Health

## 2017-10-15 ENCOUNTER — Other Ambulatory Visit: Payer: Self-pay

## 2017-10-15 ENCOUNTER — Encounter: Payer: Self-pay | Admitting: Women's Health

## 2017-10-15 VITALS — BP 102/54 | HR 88 | Wt 143.0 lb

## 2017-10-15 DIAGNOSIS — A599 Trichomoniasis, unspecified: Secondary | ICD-10-CM

## 2017-10-15 DIAGNOSIS — Z3403 Encounter for supervision of normal first pregnancy, third trimester: Secondary | ICD-10-CM

## 2017-10-15 DIAGNOSIS — Z331 Pregnant state, incidental: Secondary | ICD-10-CM

## 2017-10-15 DIAGNOSIS — O98313 Other infections with a predominantly sexual mode of transmission complicating pregnancy, third trimester: Secondary | ICD-10-CM

## 2017-10-15 DIAGNOSIS — Z1389 Encounter for screening for other disorder: Secondary | ICD-10-CM

## 2017-10-15 DIAGNOSIS — Z3A36 36 weeks gestation of pregnancy: Secondary | ICD-10-CM

## 2017-10-15 LAB — POCT URINALYSIS DIPSTICK
GLUCOSE UA: NEGATIVE
Ketones, UA: NEGATIVE
LEUKOCYTES UA: NEGATIVE
NITRITE UA: NEGATIVE
Protein, UA: NEGATIVE
RBC UA: NEGATIVE

## 2017-10-15 NOTE — Patient Instructions (Signed)
Collier FlowersJaquelin R Rappaport, I greatly value your feedback.  If you receive a survey following your visit with us today, we appreciate you taking the time to fill it out.  Thanks, Joellyn HaffKim Arieana Somoza, CNM, WHNP-BC   Call the office [phone]) or go to [facility] if:  You begin to have strong, frequent contractions  Your water breaks.  Sometimes it is a big gush of fluid, sometimes it is just a trickle that keeps getting your panties wet or running down your legs  You have vaginal bleeding.  It is normal to have a small amount of spotting if your cervix was checked.   You don't feel your baby moving like normal.  If you don't, get you something to eat and drink and lay down and focus on feeling your baby move.  You should feel at least 10 movements in 2 hours.  If you don't, you should call the office or go to [facility].     [facility] Contractions Contractions of the uterus can occur throughout pregnancy, but they are not always a sign that you are in labor. You may have practice contractions called Braxton Hicks contractions. These false labor contractions are sometimes confused with true labor. What are Deberah PeltonBraxton Hicks contractions? Braxton Hicks contractions are tightening movements that occur in the muscles of the uterus before labor. Unlike true labor contractions, these contractions do not result in opening (dilation) and thinning of the cervix. Toward the end of pregnancy (32-34 weeks), Braxton Hicks contractions can happen more often and may become stronger. These contractions are sometimes difficult to tell apart from true labor because they can be very uncomfortable. You should not feel embarrassed if you go to the hospital with false labor. Sometimes, the only way to tell if you are in true labor is for your health care provider to look for changes in the cervix. The health care provider will do a physical exam and may monitor your contractions. If you are not in true labor, the exam  should show that your cervix is not dilating and your water has not broken. If there are other health problems associated with your pregnancy, it is completely safe for you to be sent home with false labor. You may continue to have Braxton Hicks contractions until you go into true labor. How to tell the difference between true labor and false labor True labor  Contractions last 30-70 seconds.  Contractions become very regular.  Discomfort is usually felt in the top of the uterus, and it spreads to the lower abdomen and low back.  Contractions do not go away with walking.  Contractions usually become more intense and increase in frequency.  The cervix dilates and gets thinner. False labor  Contractions are usually shorter and not as strong as true labor contractions.  Contractions are usually irregular.  Contractions are often felt in the front of the lower abdomen and in the groin.  Contractions may go away when you walk around or change positions while lying down.  Contractions get weaker and are shorter-lasting as time goes on.  The cervix usually does not dilate or become thin. Follow these instructions at home:  Take over-the-counter and prescription medicines only as told by your health care provider.  Keep up with your usual exercises and follow other instructions from your health care provider.  Eat and drink lightly if you think you are going into labor.  If Braxton Hicks contractions are making you uncomfortable: ? Change your position from lying  down or resting to walking, or change from walking to resting. ? Sit and rest in a tub of warm water. ? Drink enough fluid to keep your urine pale yellow. Dehydration may cause these contractions. ? Do slow and deep breathing several times an hour.  Keep all follow-up prenatal visits as told by your health care provider. This is important. Contact a health care provider if:  You have a fever.  You have continuous pain  in your abdomen. Get help right away if:  Your contractions become stronger, more regular, and closer together.  You have fluid leaking or gushing from your vagina.  You pass blood-tinged mucus (bloody show).  You have bleeding from your vagina.  You have low back pain that you never had before.  You feel your baby's head pushing down and causing pelvic pressure.  Your baby is not moving inside you as much as it used to. Summary  Contractions that occur before labor are called Braxton Hicks contractions, false labor, or practice contractions.  Braxton Hicks contractions are usually shorter, weaker, farther apart, and less regular than true labor contractions. True labor contractions usually become progressively stronger and regular and they become more frequent.  Manage discomfort from [facility] contractions by changing position, resting in a warm bath, drinking plenty of water, or practicing deep breathing. This information is not intended to replace advice given to you by your health care provider. Make sure you discuss any questions you have with your health care provider. Document Released: 01/11/2017 Document Revised: 01/11/2017 Document Reviewed: 01/11/2017 Elsevier Interactive Patient Education  2018 [employer].

## 2017-10-15 NOTE — Progress Notes (Addendum)
   LOW-RISK PREGNANCY VISIT Patient name: Angela White MRN [id number]  Date of birth: [birth year] Chief Complaint:   Routine Prenatal Visit (gbs/gc)  History of Present Illness:   Angela White is a [age] y.o. G1P0 female at 9123w5d with an Estimated Date of Delivery: 11/07/17 being seen today for ongoing management of a low-risk pregnancy.  Today she reports no complaints. Pt took her metronidazole for +trichomonas. Has not had sex. Partner is w/ her today, he wants me to tx him.  Contractions: Irregular. Vag. Bleeding: None.  Movement: Present. denies leaking of fluid. Review of Systems:   Pertinent items are noted in HPI Denies abnormal vaginal discharge w/ itching/odor/irritation, headaches, visual changes, shortness of breath, chest pain, abdominal pain, severe nausea/vomiting, or problems with urination or bowel movements unless otherwise stated above. Pertinent History Reviewed:  Reviewed past medical,surgical, social, obstetrical and family history.  Reviewed problem list, medications and allergies. Physical Assessment:   Vitals:   10/15/17 1436  BP: (!) 102/54  Pulse: 88  Weight: 143 lb (64.9 kg)  Body mass index is 27.93 kg/m.        Physical Examination:   General appearance: Well appearing, and in no distress  Mental status: Alert, oriented to person, place, and time  Skin: Warm & dry  Cardiovascular: Normal heart rate noted  Respiratory: Normal respiratory effort, no distress  Abdomen: Soft, gravid, nontender  Pelvic: Cervical exam performed  Dilation: 4 Effacement (%): 50 Station: -2 posterior  Extremities: Edema: Trace  Fetal Status: Fetal Heart Rate (bpm): 140 Fundal Height: 34 cm Movement: Present Presentation: Vertex  Results for orders placed or performed in visit on 10/15/17 (from the past 24 hour(s))  POCT urinalysis dipstick   Collection Time: 10/15/17  2:39 PM  Result Value Ref Range   Color, UA     Clarity, UA     Glucose, UA neg    Bilirubin,  UA     Ketones, UA neg    Spec Grav, UA  1.010 - 1.025   Blood, UA neg    pH, UA  5.0 - 8.0   Protein, UA neg    Urobilinogen, UA  0.2 or 1.0 E.U./dL   Nitrite, UA neg    Leukocytes, UA Negative Negative   Appearance     Odor      Assessment & Plan:  1) Low-risk pregnancy G1P0 at 7623w5d with an Estimated Date of Delivery: 11/07/17   2) Recent + trichomonas> pt has finished tx, partner wants tx. Rx metronidazole 2gm po x 1 w/ 0RF [facility] for [facility] DOB [birth year] w/ NKDA- no etoh while taking, no sex x at least 7 d from time he has finished med. Plan poc on pt ~ 2/14   Meds: No orders of the defined types were placed in this encounter.  Labs/procedures today: gbs, gc/ct, sve  Plan:  Continue routine obstetrical care   Reviewed: Preterm labor symptoms and general obstetric precautions including but not limited to vaginal bleeding, contractions, leaking of fluid and fetal movement were reviewed in detail with the patient.  All questions were answered  Follow-up: Return in about 1 week (around 10/22/2017) for LROB.  Orders Placed This Encounter  Procedures  . GC/Chlamydia Probe Amp  . Culture, beta strep (group b only)  . POCT urinalysis dipstick   Marge DuncansBooker, Mariaeduarda Defranco Randall CNM, [facility]NP-BC 10/15/2017 3:10 PM

## 2017-10-17 LAB — GC/CHLAMYDIA PROBE AMP
CHLAMYDIA, DNA PROBE: NEGATIVE
Neisseria gonorrhoeae by PCR: NEGATIVE

## 2017-10-18 ENCOUNTER — Telehealth: Payer: Self-pay | Admitting: Advanced Practice Midwife

## 2017-10-18 NOTE — Telephone Encounter (Signed)
Patient called stating that Angela White sent a medication in for [facility] at the [facility] and it was suppose to be for 7 days, pt states that when she picked up the medication it was only one pill. Please contact pt

## 2017-10-18 NOTE — Telephone Encounter (Signed)
Informed pt that new rx for metronidazole had been sent in [facility]

## 2017-10-18 NOTE — Telephone Encounter (Signed)
Pt called stating that her boyfriend only received 250mg  metronidazole instead of the higher dosage that Selena BattenKim told him to take. Informed pt that I would let Selena BattenKim know to see if there has been a mix up with the prescription and we would get back with her. Pt verbalized understanding.

## 2017-10-19 LAB — CULTURE, BETA STREP (GROUP B ONLY): STREP GP B CULTURE: NEGATIVE

## 2017-10-20 ENCOUNTER — Encounter (HOSPITAL_COMMUNITY): Payer: Self-pay

## 2017-10-20 ENCOUNTER — Inpatient Hospital Stay (HOSPITAL_COMMUNITY)
Admission: AD | Admit: 2017-10-20 | Discharge: 2017-10-20 | Disposition: A | Discharge status: Home / Self Care | Payer: BLUE CROSS/BLUE SHIELD | Source: Ambulatory Visit | Attending: Obstetrics and Gynecology | Admitting: Obstetrics and Gynecology

## 2017-10-20 DIAGNOSIS — O26893 Other specified pregnancy related conditions, third trimester: Secondary | ICD-10-CM | POA: Diagnosis not present

## 2017-10-20 DIAGNOSIS — Z3A37 37 weeks gestation of pregnancy: Secondary | ICD-10-CM | POA: Diagnosis not present

## 2017-10-20 DIAGNOSIS — N898 Other specified noninflammatory disorders of vagina: Secondary | ICD-10-CM | POA: Insufficient documentation

## 2017-10-20 DIAGNOSIS — Z83438 Family history of other disorder of lipoprotein metabolism and other lipidemia: Secondary | ICD-10-CM | POA: Diagnosis not present

## 2017-10-20 DIAGNOSIS — Z833 Family history of diabetes mellitus: Secondary | ICD-10-CM | POA: Diagnosis not present

## 2017-10-20 LAB — URINALYSIS, ROUTINE W REFLEX MICROSCOPIC
Bilirubin Urine: NEGATIVE
Glucose, UA: 50 mg/dL — AB
Hgb urine dipstick: NEGATIVE
KETONES UR: NEGATIVE mg/dL
Nitrite: NEGATIVE
PH: 6 (ref 5.0–8.0)
Protein, ur: NEGATIVE mg/dL
SPECIFIC GRAVITY, URINE: 1.014 (ref 1.005–1.030)

## 2017-10-20 LAB — WET PREP, GENITAL
CLUE CELLS WET PREP: NONE SEEN
SPERM: NONE SEEN
TRICH WET PREP: NONE SEEN
YEAST WET PREP: NONE SEEN

## 2017-10-20 NOTE — MAU Provider Note (Signed)
History    G1 @ 37.3 wks in with c/o standing up and having gush of fluid st 1400. States was treated for trich on 10/04/17. finished all of treatment.  CSN: [id number]  Arrival date & time 10/20/17  1809   None     Chief Complaint  Patient presents with  . Rupture of Membranes    HPI  Past Medical History:  Diagnosis Date  . Anemia     Past Surgical History:  Procedure Laterality Date  . NO PAST SURGERIES      Family History  Problem Relation Age of Onset  . Gestational diabetes Mother   . Diabetes Maternal Grandmother   . Hyperlipidemia Maternal Grandmother   . Diabetes Maternal Uncle     Social History   Tobacco Use  . Smoking status: Never Smoker  . Smokeless tobacco: Never Used  Substance Use Topics  . Alcohol use: No  . Drug use: No    OB History    Gravida Para Term Preterm AB Living   1             SAB TAB Ectopic Multiple Live Births                  Review of Systems  Constitutional: Negative.   HENT: Negative.   Eyes: Negative.   Respiratory: Negative.   Cardiovascular: Negative.   Gastrointestinal: Negative.   Endocrine: Negative.   Genitourinary: Positive for vaginal discharge.  Musculoskeletal: Negative.   Skin: Negative.   Allergic/Immunologic: Negative.   Neurological: Negative.   Hematological: Negative.   Psychiatric/Behavioral: Negative.     Allergies  Patient has no known allergies.  Home Medications    BP 120/67 (BP Location: Left Arm)   Pulse 88   Temp 97.7 F (36.5 C) (Oral)   Resp 18   Ht 5' (1.524 m)   Wt 145 lb (65.8 kg)   LMP  (LMP Unknown)   BMI 28.32 kg/m   Physical Exam  Constitutional: She is oriented to person, place, and time. She appears well-developed and well-nourished.  HENT:  Head: Normocephalic.  Eyes: Pupils are equal, round, and reactive to light.  Neck: Normal range of motion.  Cardiovascular: Normal rate, regular rhythm, normal heart sounds and intact distal pulses.  Pulmonary/Chest:  Effort normal and breath sounds normal.  Abdominal: Soft. Bowel sounds are normal.  Genitourinary: Vaginal discharge found.  Musculoskeletal: Normal range of motion.  Neurological: She is alert and oriented to person, place, and time. She has normal reflexes.  Skin: Skin is warm and dry.  Psychiatric: She has a normal mood and affect. Her behavior is normal. Judgment and thought content normal.    MAU Course  Procedures (including critical care time)  Labs Reviewed  WET PREP, GENITAL  URINALYSIS, ROUTINE W REFLEX MICROSCOPIC  GC/CHLAMYDIA PROBE AMP ([place]) NOT AT [facility]   No results found.   1. Vaginal discharge during pregnancy in third trimester       MDM  VSS, FHR basline stable with 15x15 accels no decels. Occasional uc. Sterile spec exam done copious amt greenish discharge, cervix red inflamed no active leaking  Noted with cough or valsalva. Fern neg. Wet prep TNTC WBC's . Cultures obtained. Will d/c home

## 2017-10-20 NOTE — MAU Note (Signed)
Reports some leaking since 1400- trickle, clear fluid  No vaginal bleeding, irregular ctx, +FM

## 2017-10-20 NOTE — Discharge Instructions (Signed)
Braxton Hicks Contractions °Contractions of the uterus can occur throughout pregnancy, but they are not always a sign that you are in labor. You may have practice contractions called Braxton Hicks contractions. These false labor contractions are sometimes confused with true labor. °What are Braxton Hicks contractions? °Braxton Hicks contractions are tightening movements that occur in the muscles of the uterus before labor. Unlike true labor contractions, these contractions do not result in opening (dilation) and thinning of the cervix. Toward the end of pregnancy (32-34 weeks), Braxton Hicks contractions can happen more often and may become stronger. These contractions are sometimes difficult to tell apart from true labor because they can be very uncomfortable. You should not feel embarrassed if you go to the hospital with false labor. °Sometimes, the only way to tell if you are in true labor is for your health care provider to look for changes in the cervix. The health care provider will do a physical exam and may monitor your contractions. If you are not in true labor, the exam should show that your cervix is not dilating and your water has not broken. °If there are other health problems associated with your pregnancy, it is completely safe for you to be sent home with false labor. You may continue to have Braxton Hicks contractions until you go into true labor. °How to tell the difference between true labor and false labor °True labor °· Contractions last 30-70 seconds. °· Contractions become very regular. °· Discomfort is usually felt in the top of the uterus, and it spreads to the lower abdomen and low back. °· Contractions do not go away with walking. °· Contractions usually become more intense and increase in frequency. °· The cervix dilates and gets thinner. °False labor °· Contractions are usually shorter and not as strong as true labor contractions. °· Contractions are usually irregular. °· Contractions  are often felt in the front of the lower abdomen and in the groin. °· Contractions may go away when you walk around or change positions while lying down. °· Contractions get weaker and are shorter-lasting as time goes on. °· The cervix usually does not dilate or become thin. °Follow these instructions at home: °· Take over-the-counter and prescription medicines only as told by your health care provider. °· Keep up with your usual exercises and follow other instructions from your health care provider. °· Eat and drink lightly if you think you are going into labor. °· If Braxton Hicks contractions are making you uncomfortable: °? Change your position from lying down or resting to walking, or change from walking to resting. °? Sit and rest in a tub of warm water. °? Drink enough fluid to keep your urine pale yellow. Dehydration may cause these contractions. °? Do slow and deep breathing several times an hour. °· Keep all follow-up prenatal visits as told by your health care provider. This is important. °Contact a health care provider if: °· You have a fever. °· You have continuous pain in your abdomen. °Get help right away if: °· Your contractions become stronger, more regular, and closer together. °· You have fluid leaking or gushing from your vagina. °· You pass blood-tinged mucus (bloody show). °· You have bleeding from your vagina. °· You have low back pain that you never had before. °· You feel your baby’s head pushing down and causing pelvic pressure. °· Your baby is not moving inside you as much as it used to. °Summary °· Contractions that occur before labor are called Braxton   Hicks contractions, false labor, or practice contractions. °· Braxton Hicks contractions are usually shorter, weaker, farther apart, and less regular than true labor contractions. True labor contractions usually become progressively stronger and regular and they become more frequent. °· Manage discomfort from Braxton Hicks contractions by  changing position, resting in a warm bath, drinking plenty of water, or practicing deep breathing. °This information is not intended to replace advice given to you by your health care provider. Make sure you discuss any questions you have with your health care provider. °Document Released: 01/11/2017 Document Revised: 01/11/2017 Document Reviewed: 01/11/2017 °Elsevier Interactive Patient Education © 2018 [employer] ° °

## 2017-10-22 LAB — GC/CHLAMYDIA PROBE AMP (~~LOC~~) NOT AT ARMC
Chlamydia: NEGATIVE
Neisseria Gonorrhea: NEGATIVE

## 2017-10-23 ENCOUNTER — Ambulatory Visit (INDEPENDENT_AMBULATORY_CARE_PROVIDER_SITE_OTHER): Payer: BLUE CROSS/BLUE SHIELD | Admitting: Advanced Practice Midwife

## 2017-10-23 ENCOUNTER — Encounter: Payer: Self-pay | Admitting: Advanced Practice Midwife

## 2017-10-23 VITALS — BP 120/70 | HR 99 | Wt 144.0 lb

## 2017-10-23 DIAGNOSIS — Z1389 Encounter for screening for other disorder: Secondary | ICD-10-CM

## 2017-10-23 DIAGNOSIS — O98313 Other infections with a predominantly sexual mode of transmission complicating pregnancy, third trimester: Secondary | ICD-10-CM

## 2017-10-23 DIAGNOSIS — A599 Trichomoniasis, unspecified: Secondary | ICD-10-CM

## 2017-10-23 DIAGNOSIS — Z3403 Encounter for supervision of normal first pregnancy, third trimester: Secondary | ICD-10-CM

## 2017-10-23 DIAGNOSIS — Z3A37 37 weeks gestation of pregnancy: Secondary | ICD-10-CM | POA: Diagnosis not present

## 2017-10-23 DIAGNOSIS — Z331 Pregnant state, incidental: Secondary | ICD-10-CM

## 2017-10-23 LAB — POCT URINALYSIS DIPSTICK
Blood, UA: NEGATIVE
Glucose, UA: NEGATIVE
KETONES UA: NEGATIVE
Leukocytes, UA: NEGATIVE
NITRITE UA: NEGATIVE
PROTEIN UA: NEGATIVE

## 2017-10-23 NOTE — Progress Notes (Signed)
G1P0 3642w6d Estimated Date of Delivery: 11/07/17  Blood pressure 120/70, pulse 99, weight 144 lb (65.3 kg).   BP weight and urine results all reviewed and noted.  Please refer to the obstetrical flow sheet for the fundal height and fetal heart rate documentation:  Patient reports good fetal movement, denies any bleeding and no rupture of membranes symptoms or regular contractions. Patient is without complaints. All questions were answered.  Orders Placed This Encounter  Procedures  . POCT urinalysis dipstick    Plan:  Continued routine obstetrical care,   Return in about 1 week (around 10/30/2017) for LROB.

## 2017-10-23 NOTE — Patient Instructions (Signed)

## 2017-10-27 ENCOUNTER — Encounter (HOSPITAL_COMMUNITY): Payer: Self-pay | Admitting: Family Medicine

## 2017-10-27 ENCOUNTER — Other Ambulatory Visit: Payer: Self-pay

## 2017-10-27 ENCOUNTER — Inpatient Hospital Stay (HOSPITAL_COMMUNITY): Payer: BLUE CROSS/BLUE SHIELD | Admitting: Anesthesiology

## 2017-10-27 ENCOUNTER — Inpatient Hospital Stay (HOSPITAL_COMMUNITY)
Admission: AD | Admit: 2017-10-27 | Discharge: 2017-10-29 | DRG: 807 | Disposition: A | Discharge status: Home / Self Care | Payer: BLUE CROSS/BLUE SHIELD | Source: Ambulatory Visit | Attending: Obstetrics and Gynecology | Admitting: Obstetrics and Gynecology

## 2017-10-27 DIAGNOSIS — O9902 Anemia complicating childbirth: Principal | ICD-10-CM | POA: Diagnosis present

## 2017-10-27 DIAGNOSIS — Z3A38 38 weeks gestation of pregnancy: Secondary | ICD-10-CM

## 2017-10-27 DIAGNOSIS — Z3403 Encounter for supervision of normal first pregnancy, third trimester: Secondary | ICD-10-CM

## 2017-10-27 DIAGNOSIS — D649 Anemia, unspecified: Secondary | ICD-10-CM | POA: Diagnosis present

## 2017-10-27 DIAGNOSIS — Z3483 Encounter for supervision of other normal pregnancy, third trimester: Secondary | ICD-10-CM | POA: Diagnosis present

## 2017-10-27 LAB — TYPE AND SCREEN
ABO/RH(D): A POS
Antibody Screen: NEGATIVE

## 2017-10-27 LAB — CBC
HEMATOCRIT: 34.9 % — AB (ref 36.0–46.0)
Hemoglobin: 11.7 g/dL — ABNORMAL LOW (ref 12.0–15.0)
MCH: 26.9 pg (ref 26.0–34.0)
MCHC: 33.5 g/dL (ref 30.0–36.0)
MCV: 80.2 fL (ref 78.0–100.0)
Platelets: 209 10*3/uL (ref 150–400)
RBC: 4.35 MIL/uL (ref 3.87–5.11)
RDW: 15.4 % (ref 11.5–15.5)
WBC: 11.9 10*3/uL — ABNORMAL HIGH (ref 4.0–10.5)

## 2017-10-27 LAB — ABO/RH: ABO/RH(D): A POS

## 2017-10-27 MED ORDER — OXYTOCIN 40 UNITS IN LACTATED RINGERS INFUSION - SIMPLE MED
2.5000 [IU]/h | INTRAVENOUS | Status: DC
  Filled 2017-10-27: qty 1000

## 2017-10-27 MED ORDER — SOD CITRATE-CITRIC ACID 500-334 MG/5ML PO SOLN: 30.0000 mL | ORAL | Status: DC | PRN

## 2017-10-27 MED ORDER — IBUPROFEN 600 MG PO TABS
600.0000 mg | ORAL_TABLET | Freq: Four times a day (QID) | ORAL | Status: DC
  Administered 2017-10-27 – 2017-10-29 (×7): 600 mg via ORAL
  Filled 2017-10-27 (×7): qty 1

## 2017-10-27 MED ORDER — ONDANSETRON HCL 4 MG/2ML IJ SOLN: 4.0000 mg | Freq: Four times a day (QID) | INTRAMUSCULAR | Status: DC | PRN

## 2017-10-27 MED ORDER — OXYCODONE-ACETAMINOPHEN 5-325 MG PO TABS: 1.0000 | ORAL_TABLET | ORAL | Status: DC | PRN

## 2017-10-27 MED ORDER — COCONUT OIL OIL
1.0000 "application " | TOPICAL_OIL | Status: DC | PRN
  Administered 2017-10-28: 1 via TOPICAL
  Filled 2017-10-27: qty 120

## 2017-10-27 MED ORDER — ACETAMINOPHEN 325 MG PO TABS: 650.0000 mg | ORAL_TABLET | ORAL | Status: DC | PRN

## 2017-10-27 MED ORDER — PHENYLEPHRINE 40 MCG/ML (10ML) SYRINGE FOR IV PUSH (FOR BLOOD PRESSURE SUPPORT)
80.0000 ug | PREFILLED_SYRINGE | INTRAVENOUS | Status: DC | PRN
  Filled 2017-10-27: qty 10
  Filled 2017-10-27: qty 5

## 2017-10-27 MED ORDER — ZOLPIDEM TARTRATE 5 MG PO TABS: 5.0000 mg | ORAL_TABLET | Freq: Every evening | ORAL | Status: DC | PRN

## 2017-10-27 MED ORDER — WITCH HAZEL-GLYCERIN EX PADS: 1.0000 "application " | MEDICATED_PAD | CUTANEOUS | Status: DC | PRN

## 2017-10-27 MED ORDER — DIPHENHYDRAMINE HCL 50 MG/ML IJ SOLN: 12.5000 mg | INTRAMUSCULAR | Status: DC | PRN

## 2017-10-27 MED ORDER — FENTANYL CITRATE (PF) 100 MCG/2ML IJ SOLN
100.0000 ug | INTRAMUSCULAR | Status: DC | PRN
  Administered 2017-10-27: 100 ug via INTRAVENOUS
  Filled 2017-10-27: qty 2

## 2017-10-27 MED ORDER — EPHEDRINE 5 MG/ML INJ
10.0000 mg | INTRAVENOUS | Status: DC | PRN
  Filled 2017-10-27: qty 2

## 2017-10-27 MED ORDER — PRENATAL MULTIVITAMIN CH
1.0000 | ORAL_TABLET | Freq: Every day | ORAL | Status: DC
  Administered 2017-10-28 – 2017-10-29 (×2): 1 via ORAL
  Filled 2017-10-27 (×2): qty 1

## 2017-10-27 MED ORDER — SIMETHICONE 80 MG PO CHEW: 80.0000 mg | CHEWABLE_TABLET | ORAL | Status: DC | PRN

## 2017-10-27 MED ORDER — PHENYLEPHRINE 40 MCG/ML (10ML) SYRINGE FOR IV PUSH (FOR BLOOD PRESSURE SUPPORT)
80.0000 ug | PREFILLED_SYRINGE | INTRAVENOUS | Status: DC | PRN
  Filled 2017-10-27: qty 5

## 2017-10-27 MED ORDER — FENTANYL 2.5 MCG/ML BUPIVACAINE 1/10 % EPIDURAL INFUSION (WH - ANES): 14.0000 mL/h | INTRAMUSCULAR | Status: DC | PRN

## 2017-10-27 MED ORDER — DIPHENHYDRAMINE HCL 25 MG PO CAPS: 25.0000 mg | ORAL_CAPSULE | Freq: Four times a day (QID) | ORAL | Status: DC | PRN

## 2017-10-27 MED ORDER — SENNOSIDES-DOCUSATE SODIUM 8.6-50 MG PO TABS
2.0000 | ORAL_TABLET | ORAL | Status: DC
  Administered 2017-10-27 – 2017-10-28 (×2): 2 via ORAL
  Filled 2017-10-27 (×2): qty 2

## 2017-10-27 MED ORDER — LACTATED RINGERS IV SOLN
500.0000 mL | Freq: Once | INTRAVENOUS | Status: AC
  Administered 2017-10-27: 500 mL via INTRAVENOUS

## 2017-10-27 MED ORDER — OXYCODONE-ACETAMINOPHEN 5-325 MG PO TABS: 2.0000 | ORAL_TABLET | ORAL | Status: DC | PRN

## 2017-10-27 MED ORDER — PHENYLEPHRINE 40 MCG/ML (10ML) SYRINGE FOR IV PUSH (FOR BLOOD PRESSURE SUPPORT)
80.0000 ug | PREFILLED_SYRINGE | INTRAVENOUS | Status: DC | PRN
Start: 2017-10-27 — End: 2017-10-27
  Filled 2017-10-27: qty 5

## 2017-10-27 MED ORDER — LACTATED RINGERS IV SOLN: 500.0000 mL | INTRAVENOUS | Status: DC | PRN

## 2017-10-27 MED ORDER — LIDOCAINE HCL (PF) 1 % IJ SOLN
INTRAMUSCULAR | Status: DC | PRN
  Administered 2017-10-27 (×2): 4 mL via EPIDURAL

## 2017-10-27 MED ORDER — LACTATED RINGERS IV SOLN
INTRAVENOUS | Status: DC
  Administered 2017-10-27 (×2): via INTRAVENOUS

## 2017-10-27 MED ORDER — ACETAMINOPHEN 325 MG PO TABS
650.0000 mg | ORAL_TABLET | ORAL | Status: DC | PRN
  Administered 2017-10-28: 650 mg via ORAL
  Filled 2017-10-27: qty 2

## 2017-10-27 MED ORDER — OXYTOCIN BOLUS FROM INFUSION
500.0000 mL | Freq: Once | INTRAVENOUS | Status: AC
  Administered 2017-10-27: 500 mL via INTRAVENOUS

## 2017-10-27 MED ORDER — ONDANSETRON HCL 4 MG PO TABS: 4.0000 mg | ORAL_TABLET | ORAL | Status: DC | PRN

## 2017-10-27 MED ORDER — BENZOCAINE-MENTHOL 20-0.5 % EX AERO: 1.0000 "application " | INHALATION_SPRAY | CUTANEOUS | Status: DC | PRN

## 2017-10-27 MED ORDER — DIBUCAINE 1 % RE OINT: 1.0000 "application " | TOPICAL_OINTMENT | RECTAL | Status: DC | PRN

## 2017-10-27 MED ORDER — FENTANYL 2.5 MCG/ML BUPIVACAINE 1/10 % EPIDURAL INFUSION (WH - ANES)
14.0000 mL/h | INTRAMUSCULAR | Status: DC | PRN
  Administered 2017-10-27: 11 mL/h via EPIDURAL
  Filled 2017-10-27: qty 100

## 2017-10-27 MED ORDER — ONDANSETRON HCL 4 MG/2ML IJ SOLN: 4.0000 mg | INTRAMUSCULAR | Status: DC | PRN

## 2017-10-27 MED ORDER — LACTATED RINGERS IV SOLN: 500.0000 mL | Freq: Once | INTRAVENOUS | Status: DC

## 2017-10-27 MED ORDER — TETANUS-DIPHTH-ACELL PERTUSSIS 5-2.5-18.5 LF-MCG/0.5 IM SUSP: 0.5000 mL | Freq: Once | INTRAMUSCULAR | Status: DC

## 2017-10-27 MED ORDER — FLEET ENEMA 7-19 GM/118ML RE ENEM: 1.0000 | ENEMA | RECTAL | Status: DC | PRN

## 2017-10-27 MED ORDER — LIDOCAINE HCL (PF) 1 % IJ SOLN
30.0000 mL | INTRAMUSCULAR | Status: DC | PRN
  Filled 2017-10-27: qty 30

## 2017-10-27 NOTE — MAU Note (Signed)
Woke up at 0400 and went to BR. Saw clear mucous with tint blood on tissue. Ctxs every since 0430. 4cm last sve

## 2017-10-27 NOTE — Anesthesia Preprocedure Evaluation (Signed)
Anesthesia Evaluation  Patient identified by MRN, date of birth, ID band Patient awake    Reviewed: Allergy & Precautions, Patient's Chart, lab work & pertinent test results  Airway Mallampati: II  TM Distance: >3 FB Neck ROM: Full    Dental no notable dental hx. (+) Teeth Intact   Pulmonary neg pulmonary ROS,    Pulmonary exam normal breath sounds clear to auscultation       Cardiovascular negative cardio ROS Normal cardiovascular exam Rhythm:Regular Rate:Normal     Neuro/Psych negative neurological ROS  negative psych ROS   GI/Hepatic Neg liver ROS, GERD  Medicated and Controlled,  Endo/Other  negative endocrine ROS  Renal/GU negative Renal ROS  negative genitourinary   Musculoskeletal negative musculoskeletal ROS (+)   Abdominal   Peds  Hematology  (+) anemia ,   Anesthesia Other Findings   Reproductive/Obstetrics (+) Pregnancy                             Anesthesia Physical Anesthesia Plan  ASA: II  Anesthesia Plan: Epidural   Post-op Pain Management:    Induction:   PONV Risk Score and Plan:   Airway Management Planned: Natural Airway  Additional Equipment:   Intra-op Plan:   Post-operative Plan:   Informed Consent: I have reviewed the patients History and Physical, chart, labs and discussed the procedure including the risks, benefits and alternatives for the proposed anesthesia with the patient or authorized representative who has indicated his/her understanding and acceptance.     Plan Discussed with: Anesthesiologist  Anesthesia Plan Comments:         Anesthesia Quick Evaluation  

## 2017-10-27 NOTE — Anesthesia Pain Management Evaluation Note (Signed)
  CRNA Pain Management Visit Note  [name] Patella, 22 y.o., female  "Hello I am a member of the anesthesia team at [facility]. We have an anesthesia team available at all times to provide care throughout the hospital, including epidural management and anesthesia for C-section. I don't know your plan for the delivery whether it a natural birth, water birth, IV sedation, nitrous supplementation, doula or epidural, but we want to meet your pain goals."   1.Was your pain managed to your expectations on prior hospitalizations?   No prior hospitalizations  2.What is your expectation for pain management during this hospitalization?     Epidural  3.How can we help you reach that goal? Maintain epidural.  Record the patient's initial score and the patient's pain goal.   Pain: 0  Pain Goal: 4 [facility] wants you to be able to say your pain was always managed very well.  Amberlin Utke 10/27/2017

## 2017-10-27 NOTE — H&P (Signed)
Angela White is a 22 y.o. female presenting for SOL. G1P0    [facility]  Initiated Care at  8wk  [facility]  Dating By 1st trimester U/S 6wk  Pap <21  GC/CT Initial:   -/-             36+wks:  -/-  Genetic Screen NT/IT: neg  CF screen neg  Anatomic [place] EICF, otherwise normal female  Flu vaccine Recommended flu shot w/ pcp/hd (<21yo)   Tdap Recommended ~ 28wks  Glucose Screen  2 hr normal: 73/110/102  GBS neg  Feed Preference both  Contraception Nexplanon  Circumcision n/a  Childbirth Classes declined  Pediatrician [facility]     OB History    Gravida Para Term Preterm AB Living   1             SAB TAB Ectopic Multiple Live Births                 Past Medical History:  Diagnosis Date  . Anemia    Past Surgical History:  Procedure Laterality Date  . NO PAST SURGERIES     Family History: family history includes Diabetes in her maternal grandmother and maternal uncle; Gestational diabetes in her mother; Hyperlipidemia in her maternal grandmother. Social History:  reports that  has never smoked. she has never used smokeless tobacco. She reports that she does not drink alcohol or use drugs.     Maternal Diabetes: No Genetic Screening: Normal Maternal Ultrasounds/Referrals: Abnormal:  Findings:   Isolated EIF (echogenic intracardiac focus) resolved Fetal Ultrasounds or other Referrals:  None Maternal Substance Abuse:  No Significant Maternal Medications:  None Significant Maternal Lab Results:  Lab values include: Other: trich positive 10/04/17 Other Comments:  None  Review of Systems  Constitutional: Negative for chills and fever.  HENT: Negative for nosebleeds.   Cardiovascular: Negative for chest pain and palpitations.  Gastrointestinal: Negative for abdominal pain and vomiting.  Skin: Negative for rash.   Maternal Medical History:  Reason for admission: Contractions.   Contractions: Onset was 3-5 hours ago.   Frequency:  regular.   Duration is approximately 1 minute.   Perceived severity is moderate.    Fetal activity: Perceived fetal activity is normal.   Last perceived fetal movement was within the past hour.      Dilation: 4 Effacement (%): 80 Station: -3 Exam by:: Dorrene German RN Blood pressure 126/78, pulse 92, temperature 98.3 F (36.8 C), resp. rate 18, height 5' (1.524 m), weight 144 lb (65.3 kg). Maternal Exam:  Uterine Assessment: Contraction strength is moderate.  Contraction duration is 1 minute. Contraction frequency is regular.   Abdomen: Patient reports no abdominal tenderness. Cervix: not evaluated.   Physical Exam  Constitutional: She is oriented to person, place, and time. She appears well-developed and well-nourished.  GI:  Gravid, occasional ctx with appropriately firm fundus  Neurological: She is alert and oriented to person, place, and time.    Fetal Exam Fetal Monitor Review: Mode: ultrasound.   Baseline rate: 140.  Variability: moderate (5-25 bpm).   Pattern: accelerations present and no decelerations.     Prenatal labs: ABO, Rh: A/Positive/-- (07/24 1451) Antibody: Negative (11/27 0927) Rubella: 1.35 (07/24 1451) RPR: Non Reactive (11/27 0927)  HBsAg: Negative (07/24 1451)  HIV: Non Reactive (11/27 0927)  GBS:     Assessment/Plan: Cat 1 tracing  Pain control: desires epidural Anticipate SVD   Breast  Loni Muse 10/27/2017, 8:51 AM  I confirm that I have verified the information documented in the resident's note and that I have also personally reperformed the physical exam and all medical decision making activities.   Luna KitchensKathryn Eria Lozoya CNM

## 2017-10-27 NOTE — Progress Notes (Signed)
Labor Progress Note  Angela FlowersJaquelin R White is a 22 y.o. G1P0 at 6120w3d  admitted for active labor  S: patient napping, comfortable  O:  BP 120/63   Pulse 84   Temp 98.2 F (36.8 C) (Oral)   Resp 18   Ht 5' (1.524 m)   Wt 144 lb (65.3 kg)   LMP  (LMP Unknown)   SpO2 100%   BMI 28.12 kg/m   No intake/output data recorded.  FHT:  FHR: 130 bpm, variability: moderate,  accelerations:  Present,  decelerations:  Absent UC:   regular, every 3 minutes SVE:   Dilation: 4 Effacement (%): 80 Station: -3 Exam by:: Dorrene GermanJ. Lowe RN   Labs: Lab Results  Component Value Date   WBC 11.9 (H) 10/27/2017   HGB 11.7 (L) 10/27/2017   HCT 34.9 (L) 10/27/2017   MCV 80.2 10/27/2017   PLT 209 10/27/2017    Assessment / Plan: 22 y.o. G1P0 5320w3d  in early/active labor Spontaneous labor, progressing normally  Labor: Progressing normally Fetal Wellbeing:  Category I Pain Control:  Labor support without medications Anticipated MOD:  NSVD  Expectant management   Loni MuseKate Cadin Luka, MD

## 2017-10-27 NOTE — Progress Notes (Signed)
   [name] is a 22 y.o. G1P0 at 4831w3d  admitted for active labor  Subjective: Comfortable with epidural   Objective: Vitals:   10/27/17 1300 10/27/17 1330 10/27/17 1357 10/27/17 1400  BP: (!) 115/59 (!) 114/57  127/76  Pulse: 87 98  92  Resp: 18 18  18   Temp:   98.2 F (36.8 C)   TempSrc:   Oral   SpO2:      Weight:      Height:       No intake/output data recorded.  FHT:  FHR: 135 bpm, variability: moderate,  accelerations:  Present,  decelerations:  Absent UC:   regular, every 2-3 minutes SVE:   Dilation: 10 Effacement (%): 100 Station: 0 Exam by:: Chanetta Marshallimberlake, MD   Labs: Lab Results  Component Value Date   WBC 11.9 (H) 10/27/2017   HGB 11.7 (L) 10/27/2017   HCT 34.9 (L) 10/27/2017   MCV 80.2 10/27/2017   PLT 209 10/27/2017    Assessment / Plan: Spontaneous labor, progressing normally Upon exam, no bag able to be palpated. Patient now 10 cm; will labor down until she feels the urge to push.   Labor: Progressing normally Fetal Wellbeing:  Category I Pain Control:  Epidural Anticipated MOD:  NSVD  Charlesetta GaribaldiKathryn Lorraine Corian Handley CNM 10/27/2017, 2:29 PM

## 2017-10-27 NOTE — Progress Notes (Signed)
   Angela FlowersJaquelin R White is a 22 y.o. G1P0 at 7326w3d  admitted for active labor  Subjective: Feeling some pressure    Objective: Vitals:   10/27/17 1400 10/27/17 1430 10/27/17 1500 10/27/17 1530  BP: 127/76 (!) 107/54 111/63 (!) 102/56  Pulse: 92 86 91 85  Resp: 18 18 18 18   Temp:    98.1 F (36.7 C)  TempSrc:    Oral  SpO2:      Weight:      Height:       Total I/O In: -  Out: 400 [Urine:400]  FHT:  FHR: 140 bpm, variability: moderate,  accelerations:  Present,  decelerations:  Present variable with pushing UC:   regular, every 2-3 minutes SVE:   Dilation: 10 Effacement (%): 100 Station: +2 Exam by:: Chanetta Marshallimberlake, MD   Labs: Lab Results  Component Value Date   WBC 11.9 (H) 10/27/2017   HGB 11.7 (L) 10/27/2017   HCT 34.9 (L) 10/27/2017   MCV 80.2 10/27/2017   PLT 209 10/27/2017    Assessment / Plan: Spontaneous labor, progressing normally Practice pushing, anticipate SVD  Labor: Progressing normally Fetal Wellbeing:  Category I Pain Control:  Epidural Anticipated MOD:  NSVD  Angela MuseKate Alyssamarie Mounsey MD 10/27/2017, 4:02 PM

## 2017-10-27 NOTE — Anesthesia Procedure Notes (Signed)
Epidural Patient location during procedure: OB Start time: 10/27/2017 10:20 AM  Staffing Anesthesiologist: Mal AmabileFoster, Tayte Childers, MD Performed: anesthesiologist   Preanesthetic Checklist Completed: patient identified, site marked, surgical consent, pre-op evaluation, timeout performed, IV checked, risks and benefits discussed and monitors and equipment checked  Epidural Patient position: sitting Prep: site prepped and draped and DuraPrep Patient monitoring: continuous pulse ox and blood pressure Approach: midline Location: L3-L4 Injection technique: LOR air  Needle:  Needle type: Tuohy  Needle gauge: 17 G Needle length: 9 cm and 9 Needle insertion depth: 4 cm Catheter type: closed end flexible Catheter size: 19 Gauge Catheter at skin depth: 9 cm Test dose: negative and Other  Assessment Events: blood not aspirated, injection not painful, no injection resistance, negative IV test and no paresthesia  Additional Notes Patient identified. Risks and benefits discussed including failed block, incomplete  Pain control, post dural puncture headache, nerve damage, paralysis, blood pressure Changes, nausea, vomiting, reactions to medications-both toxic and allergic and post Partum back pain. All questions were answered. Patient expressed understanding and wished to proceed. Sterile technique was used throughout procedure. Epidural site was Dressed with sterile barrier dressing. No paresthesias, signs of intravascular injection Or signs of intrathecal spread were encountered.  Patient was more comfortable after the epidural was dosed. Please see RN's note for documentation of vital signs and FHR which are stable.

## 2017-10-27 NOTE — Progress Notes (Signed)
   Angela White is a 22 y.o. G1P0 at 473w3d  admitted for contractions and making cervical change in MAU.   Subjective:  Patient uncomfortable with contractions; wants epidural.   Objective: Vitals:   10/27/17 0634 10/27/17 0636 10/27/17 0857  BP:  126/78 132/90  Pulse:  92 85  Resp: 18  18  Temp: 98.3 F (36.8 C)  98.1 F (36.7 C)  TempSrc:   Oral  Weight: 144 lb (65.3 kg)    Height: 5' (1.524 m)     No intake/output data recorded.  FHT:  FHR: 120 bpm, variability: moderate,  accelerations:  Present,  decelerations:  Absent UC:   irregular, every 1-3 minutes SVE:   Dilation: 4 Effacement (%): 80 Station: -3 Exam by:: Angela White   Labs: Lab Results  Component Value Date   WBC 11.9 (H) 10/27/2017   HGB 11.7 (L) 10/27/2017   HCT 34.9 (L) 10/27/2017   MCV 80.2 10/27/2017   PLT 209 10/27/2017    Assessment / Plan: Spontaneous labor, progressing normally  Labor: Progressing normally Fetal Wellbeing:  Category I Pain Control:  Epidural Anticipated MOD:  NSVD  Angela White 10/27/2017, 9:48 AM

## 2017-10-28 ENCOUNTER — Encounter (HOSPITAL_COMMUNITY): Payer: Self-pay | Admitting: *Deleted

## 2017-10-28 LAB — CBC
HCT: 28.5 % — ABNORMAL LOW (ref 36.0–46.0)
Hemoglobin: 9.4 g/dL — ABNORMAL LOW (ref 12.0–15.0)
MCH: 26.7 pg (ref 26.0–34.0)
MCHC: 33 g/dL (ref 30.0–36.0)
MCV: 81 fL (ref 78.0–100.0)
Platelets: 181 10*3/uL (ref 150–400)
RBC: 3.52 MIL/uL — ABNORMAL LOW (ref 3.87–5.11)
RDW: 15.6 % — ABNORMAL HIGH (ref 11.5–15.5)
WBC: 15.7 10*3/uL — ABNORMAL HIGH (ref 4.0–10.5)

## 2017-10-28 LAB — RPR: RPR: NONREACTIVE

## 2017-10-28 NOTE — Clinical Social Work Note (Signed)
LCSW received consult for history of marijuana use.    Referral was screened out due to the following: ~MOB had no documented substance use  after initial prenatal visit on 04/03/17   Please consult CSW if current concerns arise

## 2017-10-28 NOTE — Anesthesia Postprocedure Evaluation (Signed)
Anesthesia Post Note  [name]  Procedure(s) Performed: AN AD HOC LABOR EPIDURAL     Patient location during evaluation: Mother Baby Anesthesia Type: Epidural Level of consciousness: awake and alert Pain management: pain level controlled Vital Signs Assessment: post-procedure vital signs reviewed and stable Respiratory status: spontaneous breathing, nonlabored ventilation and respiratory function stable Cardiovascular status: stable Postop Assessment: no headache, no backache, epidural receding and no apparent nausea or vomiting Anesthetic complications: no    Last Vitals:  Vitals:   10/27/17 1840 10/27/17 2300  BP: 110/72 (!) 112/57  Pulse: 83 (!) 105  Resp: 16 18  Temp: 36.8 C 36.8 C  SpO2:  100%    Last Pain:  Vitals:   10/28/17 0544  TempSrc:   PainSc: 5    Pain Goal: Patients Stated Pain Goal: 3 (10/27/17 2303)               [name],[name]

## 2017-10-28 NOTE — Progress Notes (Signed)
Post Partum Day 1 Subjective: no complaints, up ad lib, voiding and tolerating PO  Objective: Blood pressure (!) 112/57, pulse (!) 105, temperature 98.3 F (36.8 C), temperature source Oral, resp. rate 18, height 5' (1.524 m), weight 144 lb (65.3 kg), SpO2 100 %, unknown if currently breastfeeding.  Physical Exam:  General: alert, cooperative and no distress Lochia: appropriate Uterine Fundus: firm Incision: n/a DVT Evaluation: No evidence of DVT seen on physical exam.  Recent Labs    10/27/17 0900 10/28/17 0524  HGB 11.7* 9.4*  HCT 34.9* 28.5*    Assessment/Plan: Plan for discharge tomorrow and Breastfeeding   LOS: 1 day   Angela BourgeoisMarie Vollie White 10/28/2017, 7:38 AM

## 2017-10-29 MED ORDER — IBUPROFEN 600 MG PO TABS: 600.0000 mg | ORAL_TABLET | Freq: Four times a day (QID) | ORAL | 1 refills | Status: DC

## 2017-10-29 NOTE — Progress Notes (Signed)
Post Partum Day 2 Subjective: no complaints, up ad lib, voiding and tolerating PO  Objective: Blood pressure 107/67, pulse 85, temperature 97.8 F (36.6 C), resp. rate 18, height 5' (1.524 m), weight 144 lb (65.3 kg), SpO2 98 %, currently breastfeeding.  Physical Exam:  General: alert, cooperative, fatigued and no distress Lochia: appropriate Uterine Fundus: firm, U-2 Incision: n/a DVT Evaluation: No evidence of DVT seen on physical exam. Negative Homan's sign. No cords or calf tenderness. No significant calf/ankle edema.  Recent Labs    10/27/17 0900 10/28/17 0524  HGB 11.7* 9.4*  HCT 34.9* 28.5*    Assessment/Plan: Discharge home, Breastfeeding and Contraception Nexplanon   LOS: 2 days   Angela Moraolitta Dollie Mayse, MSN, CNM 10/29/2017, 6:03 AM

## 2017-10-29 NOTE — Lactation Note (Signed)
This note was copied from a baby's chart. Lactation Consultation Note  Patient Name: Angela White Today's Date: 10/29/2017 Reason for consult: Initial assessment   Baby 43 hours old receiving bottle of formula upon entering. Offered to help w/ breastfeeding but mother states it hurts too much along with pumping. Noted bilateral positional stripes.  Mother is wearing comfort gels. Suggest applying coconut oil to breast pump flanges and reduce suction. Recommend when she can tolerate it, mother should return to pumping q 3 hours to establish milk supply. Reviewed engorgement care and monitoring voids/stools. Offered OP appt and mother states she will call. Lactation brochure given with OP services.   Maternal Data Has patient been taught Hand Expression?: Yes Does the patient have breastfeeding experience prior to this delivery?: No  Feeding    LATCH Score          Comfort (Breast/Nipple): Engorged, cracked, bleeding, large blisters, severe discomfort        Interventions Interventions: Hand pump;DEBP;Expressed milk;Comfort gels;Coconut oil  Lactation Tools Discussed/Used     Consult Status Consult Status: Follow-up Date: 10/30/17 Follow-up type: In-patient    [name], [name] 10/29/2017, 11:46 AM

## 2017-10-29 NOTE — Discharge Instructions (Signed)
YOU HAVE 3 REFILLS OF YOUR PROTONIX PRESCRIPTION. PLEASE GO TO YOUR PHARMACY TO GET A REFILL.

## 2017-10-29 NOTE — Discharge Summary (Signed)
OB Discharge Summary     Patient Name: Angela White DOB: 1996-02-11 MRN: [id number]  Date of admission: 10/27/2017 Delivering MD: Garth Bigness   Date of discharge: 10/29/2017  Admitting diagnosis: 38 WKS LOST MUCUS PLUG AND CTX EVERY 5 MIN Intrauterine pregnancy: [redacted]w[redacted]d     Secondary diagnosis:  Active Problems:   Normal labor  Additional problems: none     Discharge diagnosis: Term Pregnancy Delivered                                                                                                Post partum procedures:none  Augmentation: none  Complications: None  Hospital course:  Onset of Labor With Vaginal Delivery     22 y.o. yo G1P1001 at [redacted]w[redacted]d was admitted in Active Labor on 10/27/2017. Patient had an uncomplicated labor course as follows:  Membrane Rupture Time/Date: 1:58 PM ,10/27/2017   Intrapartum Procedures: Episiotomy: None [1]                                         Lacerations:  1st degree [2];Labial [10]  Patient had a delivery of a Viable infant. 10/27/2017  Information for the patient's newborn:  Angela White [[id number]]  Delivery Method: Vaginal, Spontaneous(Filed from Delivery Summary)    Pateint had an uncomplicated postpartum course.  She is ambulating, tolerating a regular diet, passing flatus, and urinating well. Patient is discharged home in stable condition on 10/29/17.   Physical exam  Vitals:   10/27/17 1840 10/27/17 2300 10/28/17 1821 10/29/17 0526  BP: 110/72 (!) 112/57 (!) 104/56 107/67  Pulse: 83 (!) 105 (!) 102 85  Resp: 16 18 18 18   Temp: 98.3 F (36.8 C) 98.3 F (36.8 C) 97.8 F (36.6 C) 97.8 F (36.6 C)  TempSrc: Oral Oral Oral   SpO2:  100% 100% 98%  Weight:      Height:       General: alert, cooperative and no distress Lochia: appropriate Uterine Fundus: firm, U-2 Incision: N/A DVT Evaluation: No evidence of DVT seen on physical exam. Negative Homan's sign. No cords or calf tenderness. Labs: Lab  Results  Component Value Date   WBC 15.7 (H) 10/28/2017   HGB 9.4 (L) 10/28/2017   HCT 28.5 (L) 10/28/2017   MCV 81.0 10/28/2017   PLT 181 10/28/2017   No flowsheet data found.  Discharge instruction: per After Visit Summary and "Baby and Me Booklet".  After visit meds: Protonix 20 mg daily, PNV 1 tab daily, Ibuprofen 600 mg every 6 hrs prn pain  Diet: routine diet  Activity: Advance as tolerated. Pelvic rest for 6 weeks.   Outpatient follow up:6 weeks Follow up Appt: Future Appointments  Date Time Provider Department Center  10/30/2017 12:00 PM Cheral Marker, CNM [facility]   Follow up Visit:No Follow-up on file. -- office to call to schedule  Postpartum contraception: Nexplanon  Newborn Data: Live born female  Birth Weight: 6 lb 6.7 oz (2910 g) APGAR: 8, 9  Newborn Delivery   Birth date/time:  10/27/2017 16:15:00 Delivery type:  Vaginal, Spontaneous     Baby Feeding: Bottle and Breast Disposition:home with mother   10/29/2017 Angela White, CNM

## 2017-10-30 ENCOUNTER — Encounter: Payer: BLUE CROSS/BLUE SHIELD | Admitting: Women's Health

## 2017-11-05 ENCOUNTER — Ambulatory Visit: Payer: BLUE CROSS/BLUE SHIELD | Admitting: Women's Health

## 2017-11-13 ENCOUNTER — Ambulatory Visit: Payer: BLUE CROSS/BLUE SHIELD | Admitting: Obstetrics & Gynecology

## 2017-11-28 ENCOUNTER — Ambulatory Visit (INDEPENDENT_AMBULATORY_CARE_PROVIDER_SITE_OTHER): Payer: BLUE CROSS/BLUE SHIELD | Admitting: Women's Health

## 2017-11-28 ENCOUNTER — Encounter: Payer: Self-pay | Admitting: Women's Health

## 2017-11-28 NOTE — Patient Instructions (Signed)
NO SEX UNTIL AFTER YOU GET YOUR BIRTH CONTROL   Etonogestrel implant What is this medicine? ETONOGESTREL (et oh noe JES trel) is a contraceptive (birth control) device. It is used to prevent pregnancy. It can be used for up to 3 years. This medicine may be used for other purposes; ask your health care provider or pharmacist if you have questions. COMMON BRAND NAME(S): Implanon, Nexplanon What should I tell my health care provider before I take this medicine? They need to know if you have any of these conditions: -abnormal vaginal bleeding -blood vessel disease or blood clots -cancer of the breast, cervix, or liver -depression -diabetes -gallbladder disease -headaches -heart disease or recent heart attack -high blood pressure -high cholesterol -kidney disease -liver disease -renal disease -seizures -tobacco smoker -an unusual or allergic reaction to etonogestrel, other hormones, anesthetics or antiseptics, medicines, foods, dyes, or preservatives -pregnant or trying to get pregnant -breast-feeding How should I use this medicine? This device is inserted just under the skin on the inner side of your upper arm by a health care professional. Talk to your pediatrician regarding the use of this medicine in children. Special care may be needed. Overdosage: If you think you have taken too much of this medicine contact a poison control center or emergency room at once. NOTE: This medicine is only for you. Do not share this medicine with others. What if I miss a dose? This does not apply. What may interact with this medicine? Do not take this medicine with any of the following medications: -amprenavir -bosentan -fosamprenavir This medicine may also interact with the following medications: -barbiturate medicines for inducing sleep or treating seizures -certain medicines for fungal infections like ketoconazole and itraconazole -grapefruit juice -griseofulvin -medicines to treat  seizures like carbamazepine, felbamate, oxcarbazepine, phenytoin, topiramate -modafinil -phenylbutazone -rifampin -rufinamide -some medicines to treat HIV infection like atazanavir, indinavir, lopinavir, nelfinavir, tipranavir, ritonavir -St. John's wort This list may not describe all possible interactions. Give your health care provider a list of all the medicines, herbs, non-prescription drugs, or dietary supplements you use. Also tell them if you smoke, drink alcohol, or use illegal drugs. Some items may interact with your medicine. What should I watch for while using this medicine? This product does not protect you against HIV infection (AIDS) or other sexually transmitted diseases. You should be able to feel the implant by pressing your fingertips over the skin where it was inserted. Contact your doctor if you cannot feel the implant, and use a non-hormonal birth control method (such as condoms) until your doctor confirms that the implant is in place. If you feel that the implant may have broken or become bent while in your arm, contact your healthcare provider. What side effects may I notice from receiving this medicine? Side effects that you should report to your doctor or health care professional as soon as possible: -allergic reactions like skin rash, itching or hives, swelling of the face, lips, or tongue -breast lumps -changes in emotions or moods -depressed mood -heavy or prolonged menstrual bleeding -pain, irritation, swelling, or bruising at the insertion site -scar at site of insertion -signs of infection at the insertion site such as fever, and skin redness, pain or discharge -signs of pregnancy -signs and symptoms of a blood clot such as breathing problems; changes in vision; chest pain; severe, sudden headache; pain, swelling, warmth in the leg; trouble speaking; sudden numbness or weakness of the face, arm or leg -signs and symptoms of liver injury like dark yellow   or brown  urine; general ill feeling or flu-like symptoms; light-colored stools; loss of appetite; nausea; right upper belly pain; unusually weak or tired; yellowing of the eyes or skin -unusual vaginal bleeding, discharge -signs and symptoms of a stroke like changes in vision; confusion; trouble speaking or understanding; severe headaches; sudden numbness or weakness of the face, arm or leg; trouble walking; dizziness; loss of balance or coordination Side effects that usually do not require medical attention (report to your doctor or health care professional if they continue or are bothersome): -acne -back pain -breast pain -changes in weight -dizziness -general ill feeling or flu-like symptoms -headache -irregular menstrual bleeding -nausea -sore throat -vaginal irritation or inflammation This list may not describe all possible side effects. Call your doctor for medical advice about side effects. You may report side effects to FDA at 1-800-FDA-1088. Where should I keep my medicine? This drug is given in a hospital or clinic and will not be stored at home. NOTE: This sheet is a summary. It may not cover all possible information. If you have questions about this medicine, talk to your doctor, pharmacist, or health care provider.  2018 Elsevier/Gold Standard (2016-03-16 11:19:22)  

## 2017-11-28 NOTE — Progress Notes (Signed)
   POSTPARTUM VISIT Patient name: Angela White MRN [id number]  Date of birth: 06-21-1996 Chief Complaint:   postpartum visit (interested in Nexplanon)  History of Present Illness:   Angela FlowersJaquelin R Freshour is a 22 y.o. 441P1001 Hispanic female being seen today for a postpartum visit. She is 4 weeks postpartum following a spontaneous vaginal delivery at 38.3 gestational weeks. Anesthesia: epidural. I have fully reviewed the prenatal and intrapartum course. Pregnancy uncomplicated. Postpartum course has been uncomplicated. Bleeding no bleeding. Bowel function is normal. Bladder function is normal.  Patient is sexually active. Last sexual activity: 11/26/17.  Contraception method is none and wants nexplanon [place] Postpartum Depression Screening: negative. Score 0.   Last pap needs-just turned 22yo in Jan.  Results were n/a .  No LMP recorded.  Baby's course has been uncomplicated. Baby is feeding by bottle.  Review of Systems:   Pertinent items are noted in HPI Denies Abnormal vaginal discharge w/ itching/odor/irritation, headaches, visual changes, shortness of breath, chest pain, abdominal pain, severe nausea/vomiting, or problems with urination or bowel movements. Pertinent History Reviewed:  Reviewed past medical,surgical, obstetrical and family history.  Reviewed problem list, medications and allergies. OB History  Gravida Para Term Preterm AB Living  1 1 1     1   SAB TAB Ectopic Multiple Live Births        0 1    # Outcome Date GA Lbr Len/2nd Weight Sex Delivery Anes PTL Lv  1 Term 10/27/17 [id number] 09:58 / 02:17 6 lb 6.7 oz (2.91 kg) F Vag-Spont EPI  LIV     Physical Assessment:   Vitals:   11/28/17 1032  BP: 118/88  Pulse: 60  Weight: 123 lb 8 oz (56 kg)  Height: 5' (1.524 m)  Body mass index is 24.12 kg/m.       Physical Examination:   General appearance: alert, well appearing, and in no distress  Mental status: alert, oriented to person, place, and time  Skin: warm  & dry   Cardiovascular: normal heart rate noted   Respiratory: normal respiratory effort, no distress   Breasts: deferred, no complaints   Abdomen: soft, non-tender   Pelvic: VULVA: normal appearing vulva with no masses, tenderness or lesions, VAGINA: normal appearing vagina with normal color and discharge, no lesions, UTERUS: uterus is normal size, shape, consistency and nontender  Rectal: no hemorrhoids  Extremities: no edema       No results found for this or any previous visit (from the past 24 hour(s)).  Assessment & Plan:  1) Postpartum exam 2) 4 wks s/p SVB 3) Bottlefeeding 4) Depression screening 5) Contraception counseling, pt prefers Nexplanon, abstinence until insertion  Meds: No orders of the defined types were placed in this encounter.   Follow-up: Return in about 12 days (around 12/10/2017) for Nexplanon insertion, then w/in next 4wks for pap & physical.   No orders of the defined types were placed in this encounter.   Cheral MarkerKimberly R Booker CNM, [facility] 11/28/2017 10:50 AM

## 2017-12-11 ENCOUNTER — Ambulatory Visit (INDEPENDENT_AMBULATORY_CARE_PROVIDER_SITE_OTHER): Payer: BLUE CROSS/BLUE SHIELD | Admitting: Women's Health

## 2017-12-11 ENCOUNTER — Encounter: Payer: Self-pay | Admitting: Women's Health

## 2017-12-11 VITALS — BP 106/60 | HR 66 | Ht 60.0 in | Wt 124.0 lb

## 2017-12-11 DIAGNOSIS — Z3046 Encounter for surveillance of implantable subdermal contraceptive: Secondary | ICD-10-CM

## 2017-12-11 DIAGNOSIS — Z3202 Encounter for pregnancy test, result negative: Secondary | ICD-10-CM | POA: Diagnosis not present

## 2017-12-11 DIAGNOSIS — Z30017 Encounter for initial prescription of implantable subdermal contraceptive: Secondary | ICD-10-CM | POA: Insufficient documentation

## 2017-12-11 LAB — POCT URINE PREGNANCY: Preg Test, Ur: NEGATIVE

## 2017-12-11 MED ORDER — ETONOGESTREL 68 MG ~~LOC~~ IMPL
68.0000 mg | DRUG_IMPLANT | Freq: Once | SUBCUTANEOUS | Status: AC
  Administered 2017-12-11: 68 mg via SUBCUTANEOUS

## 2017-12-11 NOTE — Patient Instructions (Signed)

## 2017-12-11 NOTE — Progress Notes (Signed)
   NEXPLANON INSERTION Patient name: Angela White MRN [id number]  Date of birth: Jul 01, 1996 Subjective Findings:   Angela White is a 22 y.o. 621P1001 Hispanic female being seen today for insertion of a Nexplanon.   Patient's last menstrual period was 11/28/2017 (approximate). Last sexual intercourse was 11/26/17 Last pap never, just turned 22yo. Results were:  n/a  Risks/benefits/side effects of Nexplanon have been discussed and her questions have been answered.  Specifically, a failure rate of 09/998 has been reported, with an increased failure rate if pt takes St. John's Wort and/or antiseizure medicaitons.  She is aware of the common side effect of irregular bleeding, which the incidence of decreases over time. Signed copy of informed consent in chart.  Pertinent History Reviewed:   Reviewed past medical,surgical, social, obstetrical and family history.  Reviewed problem list, medications and allergies. Objective Findings & Procedure:    Vitals:   12/11/17 1204  BP: 106/60  Pulse: 66  Weight: 124 lb (56.2 kg)  Height: 5' (1.524 m)  Body mass index is 24.22 kg/m.  Results for orders placed or performed in visit on 12/11/17 (from the past 24 hour(s))  POCT urine pregnancy   Collection Time: 12/11/17 12:07 PM  Result Value Ref Range   Preg Test, Ur Negative Negative     Time out was performed.  She is right-handed, so her left arm, approximately 10cm from the medial epicondyle and 3-5cm posterior to the sulcus, was cleansed with alcohol and anesthetized with 2cc of 2% Lidocaine.  The area was cleansed again with betadine and the Nexplanon was inserted per manufacturer's recommendations without difficulty.  3 steri-strips and pressure bandage were applied. The patient tolerated the procedure well.  Assessment & Plan:   1) Nexplanon insertion Pt was instructed to keep the area clean and dry, remove pressure bandage in 24 hours, and keep insertion site covered with the  steri-strip for 3-5 days.  Back up contraception was recommended for 2 weeks.  She was given a card indicating date Nexplanon was inserted and date it needs to be removed. Follow-up PRN problems.  Orders Placed This Encounter  Procedures  . POCT urine pregnancy    Follow-up: Return for As scheduled for pap. 12/26/17  Cheral MarkerKimberly R Alexiana Laverdure CNM, WHNP-BC 12/11/2017 12:41 PM

## 2017-12-11 NOTE — Addendum Note (Signed)
Addended by: Colen DarlingYOUNG, Dois Juarbe S on: 12/11/2017 12:48 PM   Modules accepted: Orders

## 2017-12-26 ENCOUNTER — Encounter: Payer: Self-pay | Admitting: *Deleted

## 2017-12-26 ENCOUNTER — Other Ambulatory Visit: Payer: BLUE CROSS/BLUE SHIELD | Admitting: Women's Health

## 2017-12-26 ENCOUNTER — Telehealth: Payer: Self-pay | Admitting: *Deleted

## 2018-01-02 DIAGNOSIS — Z029 Encounter for administrative examinations, unspecified: Secondary | ICD-10-CM

## 2018-01-14 ENCOUNTER — Other Ambulatory Visit: Payer: BLUE CROSS/BLUE SHIELD | Admitting: Women's Health

## 2018-01-14 ENCOUNTER — Encounter: Payer: Self-pay | Admitting: *Deleted

## 2018-01-14 ENCOUNTER — Telehealth: Payer: Self-pay | Admitting: Women's Health

## 2018-01-14 MED ORDER — MEGESTROL ACETATE 40 MG PO TABS
ORAL_TABLET | ORAL | 1 refills | Status: DC
Start: 2018-01-14 — End: 2018-02-14

## 2018-01-14 NOTE — Telephone Encounter (Signed)
LMOVM that Megace was sent to pharmacy.  

## 2018-01-14 NOTE — Telephone Encounter (Signed)
Patient called stating that Selena Batten told her that her Nexplanon she was suppose have light bleeding, pt states that she has been bleeding since she had it done and has not stopped. Pt would like Kim to call her in something to stop her bleeding. Pt uses [facility]

## 2018-02-14 ENCOUNTER — Telehealth: Payer: Self-pay | Admitting: Women's Health

## 2018-02-14 MED ORDER — MEGESTROL ACETATE 40 MG PO TABS: ORAL_TABLET | ORAL | 1 refills | Status: DC

## 2018-02-14 NOTE — Telephone Encounter (Signed)
Spoke with pt. Pt was prescribed Megace to help with bleeding. Pt states she didn't take med properly and now she is out of the med. She is still having heavy bleeding. Pt would like a refill on Megace and she states she is going to take it like she's supposed to. Pt has Nexplanon. Please advise. JSY

## 2018-03-22 ENCOUNTER — Telehealth: Payer: Self-pay | Admitting: Obstetrics & Gynecology

## 2018-03-22 NOTE — Telephone Encounter (Signed)
Spoke with pt. Pt got Nexplanon placed on 12/11/17. Pt has bleeding everyday. Some days it's light other days it's heavy. Pt was prescribed Megace in June but bleeding stopped so she didn't get it filled. I advised to call pharmacy and have them fill prescription and take as directed. Advised to let us know if she has any problems.Pt voiced understanding. JSY

## 2018-11-11 NOTE — Telephone Encounter (Signed)
Note sent to nurse. 

## 2018-12-09 ENCOUNTER — Telehealth: Payer: Self-pay | Admitting: Obstetrics & Gynecology

## 2018-12-09 ENCOUNTER — Telehealth: Payer: Self-pay | Admitting: Women's Health

## 2018-12-09 MED ORDER — MEGESTROL ACETATE 40 MG PO TABS: ORAL_TABLET | ORAL | 3 refills | Status: DC

## 2018-12-09 NOTE — Telephone Encounter (Signed)
Megace e prescribed 

## 2018-12-09 NOTE — Telephone Encounter (Signed)
Pt having breakthrough bleeding on Nexplanon for 3 weeks. She would like to have Megace sent in to help. She denies any discharge or new sexual partner. Advised to check with pharmacy later today.

## 2018-12-09 NOTE — Telephone Encounter (Signed)
Patient called stating that she would like a call back from the nurse. Patient did not state the reason why. Please contact pt

## 2018-12-25 ENCOUNTER — Other Ambulatory Visit: Payer: Self-pay | Admitting: Women's Health

## 2019-01-23 DIAGNOSIS — L700 Acne vulgaris: Secondary | ICD-10-CM | POA: Diagnosis not present

## 2019-02-11 ENCOUNTER — Other Ambulatory Visit: Payer: Self-pay | Admitting: Women's Health

## 2019-02-20 ENCOUNTER — Encounter: Payer: Self-pay | Admitting: Women's Health

## 2019-02-21 ENCOUNTER — Other Ambulatory Visit: Payer: Self-pay | Admitting: Women's Health

## 2019-07-22 ENCOUNTER — Ambulatory Visit: Payer: BC Managed Care – PPO | Admitting: Women's Health

## 2019-07-22 ENCOUNTER — Other Ambulatory Visit (HOSPITAL_COMMUNITY)
Admission: RE | Admit: 2019-07-22 | Discharge: 2019-07-22 | Disposition: A | Discharge status: Home / Self Care | Payer: BC Managed Care – PPO | Source: Ambulatory Visit | Attending: Obstetrics & Gynecology | Admitting: Obstetrics & Gynecology

## 2019-07-22 ENCOUNTER — Other Ambulatory Visit: Payer: Self-pay

## 2019-07-22 ENCOUNTER — Encounter: Payer: Self-pay | Admitting: Women's Health

## 2019-07-22 VITALS — BP 117/75 | HR 77 | Ht 60.0 in | Wt 116.5 lb

## 2019-07-22 DIAGNOSIS — Z113 Encounter for screening for infections with a predominantly sexual mode of transmission: Secondary | ICD-10-CM | POA: Diagnosis not present

## 2019-07-22 NOTE — Progress Notes (Signed)
   GYN VISIT Patient name: Angela White MRN [id number]  Date of birth: 05-22-1996 Chief Complaint:   std screening  History of Present Illness:   Angela White is a 23 y.o. G63P1001 female being seen today for STD screen. Hasn't been in contact w/ anything she is aware of, just wants to be checked. Has noticed a discharge over the last week. No itching/odor/irritation. Has some intermenstrual bleeding.     Patient's last menstrual period was 07/08/2019. The current method of family planning is Nexplanon.  Last pap never. Results were:  n/a Review of Systems:   Pertinent items are noted in HPI Denies fever/chills, dizziness, headaches, visual disturbances, fatigue, shortness of breath, chest pain, abdominal pain, vomiting, abnormal vaginal discharge/itching/odor/irritation, problems with periods, bowel movements, urination, or intercourse unless otherwise stated above.  Pertinent History Reviewed:  Reviewed past medical,surgical, social, obstetrical and family history.  Reviewed problem list, medications and allergies. Physical Assessment:   Vitals:   07/22/19 1114  BP: 117/75  Pulse: 77  Weight: 116 lb 8 oz (52.8 kg)  Height: 5' (1.524 m)  Body mass index is 22.75 kg/m.       Physical Examination:   General appearance: alert, well appearing, and in no distress  Mental status: alert, oriented to person, place, and time  Skin: warm & dry   Cardiovascular: normal heart rate noted  Respiratory: normal respiratory effort, no distress  Abdomen: soft, non-tender   Pelvic: VULVA: normal appearing vulva with no masses, tenderness or lesions, VAGINA: normal appearing vagina with normal color and discharge, no lesions, CERVIX: normal appearing cervix without discharge or lesions  Extremities: no edema   Chaperone: Amanda Rash    No results found for this or any previous visit (from the past 24 hour(s)).  Assessment & Plan:  1) STD screen, vaginal d/c> vaginal swab sent, does  not want HIV, RPR  2) Intermenstrual bleeding> if swab neg, likely d/t nexplanon  3) Past due for pap> schedule today  Meds: No orders of the defined types were placed in this encounter.   No orders of the defined types were placed in this encounter.   Return for 1st available, Pap & physical.  Roma Schanz CNM, [facility] 07/22/2019 11:29 AM

## 2019-07-23 LAB — CERVICOVAGINAL ANCILLARY ONLY
Bacterial Vaginitis (gardnerella): POSITIVE — AB
Candida Glabrata: NEGATIVE
Candida Vaginitis: NEGATIVE
Chlamydia: POSITIVE — AB
Comment: NEGATIVE
Comment: NEGATIVE
Comment: NEGATIVE
Comment: NEGATIVE
Comment: NEGATIVE
Comment: NORMAL
Neisseria Gonorrhea: NEGATIVE
Trichomonas: NEGATIVE

## 2019-07-24 ENCOUNTER — Other Ambulatory Visit: Payer: Self-pay | Admitting: Women's Health

## 2019-07-24 MED ORDER — METRONIDAZOLE 500 MG PO TABS: 500.0000 mg | ORAL_TABLET | Freq: Two times a day (BID) | ORAL | 0 refills | Status: DC

## 2019-07-24 MED ORDER — AZITHROMYCIN 500 MG PO TABS: 1000.0000 mg | ORAL_TABLET | Freq: Once | ORAL | 0 refills | Status: AC

## 2019-08-12 ENCOUNTER — Other Ambulatory Visit: Payer: BC Managed Care – PPO | Admitting: Women's Health

## 2019-08-20 ENCOUNTER — Other Ambulatory Visit: Payer: BC Managed Care – PPO | Admitting: Women's Health

## 2019-09-30 ENCOUNTER — Other Ambulatory Visit: Payer: Self-pay

## 2019-10-01 ENCOUNTER — Telehealth: Payer: Self-pay | Admitting: *Deleted

## 2019-10-01 MED ORDER — AZITHROMYCIN 500 MG PO TABS: ORAL_TABLET | ORAL | 0 refills | Status: DC

## 2019-10-01 MED ORDER — METRONIDAZOLE 500 MG PO TABS: 500.0000 mg | ORAL_TABLET | Freq: Two times a day (BID) | ORAL | 0 refills | Status: DC

## 2019-10-01 NOTE — Telephone Encounter (Signed)
Pt lost her azithromycin and Flagyl that was prescribed in Nov. She only took one dose of flagyl. Would like them sent in again.

## 2019-10-01 NOTE — Telephone Encounter (Signed)
Left  message that Rx sent to [facility] for flagyl and azithromycin, no sex and call for appt for POC in 4 weeks, can do self swab.

## 2020-01-24 ENCOUNTER — Inpatient Hospital Stay
Admission: RE | Admit: 2020-01-24 | Discharge: 2020-01-24 | Disposition: A | Discharge status: Home / Self Care | Payer: BC Managed Care – PPO | Source: Ambulatory Visit

## 2020-01-29 ENCOUNTER — Ambulatory Visit (INDEPENDENT_AMBULATORY_CARE_PROVIDER_SITE_OTHER)
Admission: RE | Admit: 2020-01-29 | Discharge: 2020-01-29 | Disposition: A | Discharge status: Home / Self Care | Payer: BC Managed Care – PPO | Source: Ambulatory Visit

## 2020-01-29 DIAGNOSIS — H5789 Other specified disorders of eye and adnexa: Secondary | ICD-10-CM | POA: Diagnosis not present

## 2020-01-29 NOTE — Discharge Instructions (Addendum)
You have a burst vessel in your left eye.  This will self resolve over the next 2 weeks.  If you experience any changes in vision, follow up with ophthalmology.

## 2020-01-29 NOTE — ED Provider Notes (Signed)
[facility]  Virtual Visit via Video Note:  Angela White  initiated request for Telemedicine visit with [facility] Urgent Care team. I connected with [name]  on 01/29/2020 at 1:39 PM  for a synchronized telemedicine visit using a video enabled HIPPA compliant telemedicine application. I verified that I am speaking with [name]  using two identifiers. [name], NP  was physically located in a [facility] Urgent care site and SHANDI GODFREY was located at a different location.   The limitations of evaluation and management by telemedicine as well as the availability of in-person appointments were discussed. Patient was informed that she  may incur a bill ( including co-pay) for this virtual visit encounter. Angela White  expressed understanding and gave verbal consent to proceed with virtual visit.   [id number] 01/29/20 Arrival Time: 1325  CC: EYE REDNESS  SUBJECTIVE: History from: patient.  Angela White is a 24 y.o. female who presents with abrupt onset of left eye redness about 2 days ago. Denies sick exposure to anyone. Has made no attempts to treat at home. There are no aggravating factors. Denies previous symptoms in the past. Denies precipitating event. Reports that she woke up with her L eye red x 2 days ago.  Denies fever, chills, fatigue, ear pain, sinus pain, rhinorrhea, nasal congestion, cough, SOB, wheezing, chest pain, nausea, rash, changes in bowel or bladder habits.     ROS: As per HPI.  All other pertinent ROS negative.     Past Medical History:  Diagnosis Date  . Anemia    Past Surgical History:  Procedure Laterality Date  . NO PAST SURGERIES     No Known Allergies No current facility-administered medications on file prior to encounter.   Current Outpatient Medications on File Prior to Encounter  Medication Sig Dispense Refill  . azithromycin (ZITHROMAX) 500 MG tablet Take 2 po now 2 tablet 0  .  etonogestrel (NEXPLANON) 68 MG IMPL implant 1 each by Subdermal route once.    . metroNIDAZOLE (FLAGYL) 500 MG tablet Take 1 tablet (500 mg total) by mouth 2 (two) times daily. 14 tablet 0   Social History   Socioeconomic History  . Marital status: Single    Spouse name: Not on file  . Number of children: Not on file  . Years of education: Not on file  . Highest education level: Not on file  Occupational History  . Not on file  Tobacco Use  . Smoking status: Never Smoker  . Smokeless tobacco: Never Used  Substance and Sexual Activity  . Alcohol use: No  . Drug use: No  . Sexual activity: Not Currently    Birth control/protection: None  Other Topics Concern  . Not on file  Social History Narrative  . Not on file   Social Determinants of Health   Financial Resource Strain:   . Difficulty of Paying Living Expenses:   Food Insecurity:   . Worried About Programme researcher, broadcasting/film/video in the Last Year:   . Barista in the Last Year:   Transportation Needs:   . Freight forwarder (Medical):   Marland Kitchen Lack of Transportation (Non-Medical):   Physical Activity:   . Days of Exercise per Week:   . Minutes of Exercise per Session:   Stress:   . Feeling of Stress :   Social Connections:   . Frequency of Communication with Friends and Family:   . Frequency of Social  Gatherings with Friends and Family:   . Attends Religious Services:   . Active Member of Clubs or Organizations:   . Attends Archivist Meetings:   Marland Kitchen Marital Status:   Intimate Partner Violence:   . Fear of Current or Ex-Partner:   . Emotionally Abused:   Marland Kitchen Physically Abused:   . Sexually Abused:    Family History  Problem Relation Age of Onset  . Gestational diabetes Mother   . Diabetes Maternal Grandmother   . Hyperlipidemia Maternal Grandmother   . Diabetes Maternal Uncle     OBJECTIVE:   There were no vitals filed for this visit.  General appearance: alert; no distress Eyes: EOMI grossly, Left  outer conjunctiva with redness and evidence of burst blood vessel HENT: normocephalic; atraumatic Neck: supple with FROM Lungs: normal respiratory effort; speaking in full sentences without difficulty Extremities: moves extremities without difficulty Skin: No obvious rashes Neurologic: No facial asymmetries Psychological: alert and cooperative; normal mood and affect  ASSESSMENT & PLAN:  1. Redness of eye, left     No orders of the defined types were placed in this encounter.   Eye redness This will self resolve over about 2 weeks If there are any vision changes, follow up with ophthalmology  Push fluids and get rest Take OTC ibuprofen or tylenol as needed for pain Follow up with PCP if symptoms persist Return or go to ER if you have any new or worsening symptoms such as fever, chills, nausea, vomiting, worsening sore throat, cough, abdominal pain, chest pain, changes in bowel or bladder habits.  I discussed the assessment and treatment plan with the patient. The patient was provided an opportunity to ask questions and all were answered. The patient agreed with the plan and demonstrated an understanding of the instructions.   The patient was advised to call back or seek an in-person evaluation if the symptoms worsen or if the condition fails to improve as anticipated.  I provided 10 minutes of non-face-to-face time during this encounter.  [name], NP  01/29/2020 1:39 PM         Faustino Congress, NP 01/29/20 1339

## 2020-02-03 ENCOUNTER — Ambulatory Visit: Payer: BC Managed Care – PPO | Admitting: Adult Health

## 2020-02-27 ENCOUNTER — Other Ambulatory Visit: Payer: Self-pay

## 2020-09-17 ENCOUNTER — Other Ambulatory Visit: Payer: Self-pay

## 2020-09-17 ENCOUNTER — Ambulatory Visit
Admission: EM | Admit: 2020-09-17 | Discharge: 2020-09-17 | Disposition: A | Discharge status: Home / Self Care | Payer: BC Managed Care – PPO | Attending: Family Medicine | Admitting: Family Medicine

## 2020-09-17 DIAGNOSIS — Z1152 Encounter for screening for COVID-19: Secondary | ICD-10-CM

## 2020-09-17 NOTE — ED Triage Notes (Signed)
Needs covid test

## 2020-09-21 LAB — NOVEL CORONAVIRUS, NAA: SARS-CoV-2, NAA: DETECTED — AB

## 2020-10-15 ENCOUNTER — Other Ambulatory Visit: Payer: Self-pay | Admitting: Women's Health

## 2020-10-15 ENCOUNTER — Other Ambulatory Visit: Payer: Self-pay

## 2020-10-15 ENCOUNTER — Telehealth: Payer: Self-pay | Admitting: Women's Health

## 2020-10-15 NOTE — Telephone Encounter (Signed)
Pt requesting refill on megestrol (MEGACE) 40 MG tablet States she had a refill that expired before she could get it filled  Please advise & notify pt    [facility]

## 2020-10-18 ENCOUNTER — Telehealth: Payer: BC Managed Care – PPO | Admitting: Oncology

## 2020-10-18 ENCOUNTER — Telehealth: Payer: Self-pay | Admitting: Adult Health

## 2020-10-18 DIAGNOSIS — N926 Irregular menstruation, unspecified: Secondary | ICD-10-CM | POA: Diagnosis not present

## 2020-10-18 DIAGNOSIS — N939 Abnormal uterine and vaginal bleeding, unspecified: Secondary | ICD-10-CM

## 2020-10-18 MED ORDER — MEGESTROL ACETATE 40 MG PO TABS: ORAL_TABLET | ORAL | 1 refills | Status: DC

## 2020-10-18 NOTE — Telephone Encounter (Signed)
Pt had an appt with urgent care today and will start something to help with bleeding. She has Nexplanon. Pt will keep her appt 2/23 for pap. JSY

## 2020-10-18 NOTE — Progress Notes (Signed)
Angela White, Angela White are scheduled for a virtual visit with your provider today.    Just as we do with appointments in the office, we must obtain your consent to participate.  Your consent will be active for this visit and any virtual visit you may have with one of our providers in the next 365 days.    If you have a MyChart account, I can also send a copy of this consent to you electronically.  All virtual visits are billed to your insurance company just like a traditional visit in the office.  As this is a virtual visit, video technology does not allow for your provider to perform a traditional examination.  This may limit your provider's ability to fully assess your condition.  If your provider identifies any concerns that need to be evaluated in person or the need to arrange testing such as labs, EKG, etc, we will make arrangements to do so.    Although advances in technology are sophisticated, we cannot ensure that it will always work on either your end or our end.  If the connection with a video visit is poor, we may have to switch to a telephone visit.  With either a video or telephone visit, we are not always able to ensure that we have a secure connection.   I need to obtain your verbal consent now.   Are you willing to proceed with your visit today?   NIMAH UPHOFF has provided verbal consent on 10/18/2020 for a virtual visit (video or telephone).   Mauro Kaufmann, NP 10/18/2020  12:30 PM   Virtual Visit via Video Note  I connected with Angela White on 10/18/20 at 12:45 PM EST by a video enabled telemedicine application and verified that I am speaking with the correct person using two identifiers.  Location: Patient: Home Provider: Clinic    I discussed the limitations of evaluation and management by telemedicine and the availability of in person appointments. The patient expressed understanding and agreed to proceed.  History of Present Illness: Angela White is a  25 year old female with past medical history significant for bacterial vaginosis and irregular periods currently on Nexplanon implant for family-planning.  Today she reports heavy vaginal bleeding x2 weeks.  Initially bleeding consisted of clots but now it is just heavy saturating a large pad every 6-8 hours.  Historically she has several periods in 1 month.  Patient has not been seen in gynecology for over a year.  prescription for Megace which helped tremendously.  Endorses some dizziness/lightheaded with standing.  She is eating and drinking well.    Observations/Objective: Review of Systems  Constitutional: Positive for malaise/fatigue.  Genitourinary:       Heavy menstrual bleeding   Neurological: Positive for dizziness and weakness.   Physical Exam Constitutional:      Appearance: Normal appearance.  Pulmonary:     Effort: Pulmonary effort is normal.  Neurological:     Mental Status: She is alert and oriented to person, place, and time.  Psychiatric:        Mood and Affect: Mood normal.        Behavior: Behavior normal.     Assessment and Plan:  Abnormal vaginal bleeding: -Unclear etiology. -Is followed by gynecology. -She has not been seen in over 1 year. -Previously was treated with Megace.  -Has an appointment scheduled for 11/03/2020 for follow-up with Pap smear -I reached out to previously seen gynecologist who recommends patient call the clinic to get a  sooner appointment. -In the meantime I will prescribe about 2 weeks of Megace to hopefully slow down the bleeding. -Patient will likely need some lab work including iron levels. -We did discuss that if symptoms worsen or fail to improve, patient to report directly to urgent care or emergency room for labs and possible blood or iron infusion.  Follow Up Instructions: -Call GYN clinic to get sooner appointment. -Prescription sent for Megace.   I discussed the assessment and treatment plan with the patient. The patient  was provided an opportunity to ask questions and all were answered. The patient agreed with the plan and demonstrated an understanding of the instructions.   The patient was advised to call back or seek an in-person evaluation if the symptoms worsen or if the condition fails to improve as anticipated.  I provided 12 minutes of face-to-face time during this encounter.   Mauro Kaufmann, NP

## 2020-10-18 NOTE — Telephone Encounter (Signed)
Called pt, left vm stating that she would need a pap and physical before her Megace prescription could be refilled. Instructed her to call the office to set that appt.

## 2020-10-18 NOTE — Telephone Encounter (Signed)
Pt called, we scheduled her PAP for 11/03/2020 w/S. Weinhold (1st avail PAP appt)   Pt asks what's she supposed to do until then, she's been bleeding for 2 weeks  Please advise & notify pt

## 2020-11-03 ENCOUNTER — Other Ambulatory Visit: Payer: BC Managed Care – PPO | Admitting: Advanced Practice Midwife

## 2020-12-13 ENCOUNTER — Encounter: Payer: Self-pay | Admitting: Women's Health

## 2020-12-13 ENCOUNTER — Ambulatory Visit (INDEPENDENT_AMBULATORY_CARE_PROVIDER_SITE_OTHER): Payer: BC Managed Care – PPO | Admitting: Women's Health

## 2020-12-13 ENCOUNTER — Other Ambulatory Visit: Payer: Self-pay

## 2020-12-13 VITALS — BP 121/78 | HR 84 | Ht 60.0 in | Wt 116.5 lb

## 2020-12-13 DIAGNOSIS — Z3046 Encounter for surveillance of implantable subdermal contraceptive: Secondary | ICD-10-CM

## 2020-12-13 DIAGNOSIS — Z30017 Encounter for initial prescription of implantable subdermal contraceptive: Secondary | ICD-10-CM

## 2020-12-13 DIAGNOSIS — Z3202 Encounter for pregnancy test, result negative: Secondary | ICD-10-CM | POA: Diagnosis not present

## 2020-12-13 DIAGNOSIS — A749 Chlamydial infection, unspecified: Secondary | ICD-10-CM | POA: Diagnosis not present

## 2020-12-13 LAB — POCT URINE PREGNANCY: Preg Test, Ur: NEGATIVE

## 2020-12-13 MED ORDER — ETONOGESTREL 68 MG ~~LOC~~ IMPL
68.0000 mg | DRUG_IMPLANT | Freq: Once | SUBCUTANEOUS | Status: AC
  Administered 2020-12-13: 68 mg via SUBCUTANEOUS

## 2020-12-13 NOTE — Patient Instructions (Addendum)
Keep the area clean and dry.  You can remove the big bandage in 24 hours, and the small steri-strip bandage in 3-5 days.  A back up method, such as condoms, should be used for two weeks. You may have irregular vaginal bleeding for the first 6 months after the Nexplanon is placed, then the bleeding usually lightens and it is possible that you may not have any periods.  If you have any concerns, please give us a call.    Etonogestrel implant What is this medicine? ETONOGESTREL (et oh noe JES trel) is a contraceptive (birth control) device. It is used to prevent pregnancy. It can be used for up to 3 years. This medicine may be used for other purposes; ask your health care provider or pharmacist if you have questions. COMMON BRAND NAME(S): Implanon, Nexplanon What should I tell my health care provider before I take this medicine? They need to know if you have any of these conditions:  abnormal vaginal bleeding  blood vessel disease or blood clots  breast, cervical, endometrial, ovarian, liver, or uterine cancer  diabetes  gallbladder disease  heart disease or recent heart attack  high blood pressure  high cholesterol or triglycerides  kidney disease  liver disease  migraine headaches  seizures  stroke  tobacco smoker  an unusual or allergic reaction to etonogestrel, anesthetics or antiseptics, other medicines, foods, dyes, or preservatives  pregnant or trying to get pregnant  breast-feeding How should I use this medicine? This device is inserted just under the skin on the inner side of your upper arm by a health care professional. Talk to your pediatrician regarding the use of this medicine in children. Special care may be needed. Overdosage: If you think you have taken too much of this medicine contact a poison control center or emergency room at once. NOTE: This medicine is only for you. Do not share this medicine with others. What if I miss a dose? This does not  apply. What may interact with this medicine? Do not take this medicine with any of the following medications:  amprenavir  fosamprenavir This medicine may also interact with the following medications:  acitretin  aprepitant  armodafinil  bexarotene  bosentan  carbamazepine  certain medicines for fungal infections like fluconazole, ketoconazole, itraconazole and voriconazole  certain medicines to treat hepatitis, HIV or AIDS  cyclosporine  felbamate  griseofulvin  lamotrigine  modafinil  oxcarbazepine  phenobarbital  phenytoin  primidone  rifabutin  rifampin  rifapentine  St. John's wort  topiramate This list may not describe all possible interactions. Give your health care provider a list of all the medicines, herbs, non-prescription drugs, or dietary supplements you use. Also tell them if you smoke, drink alcohol, or use illegal drugs. Some items may interact with your medicine. What should I watch for while using this medicine? This product does not protect you against HIV infection (AIDS) or other sexually transmitted diseases. You should be able to feel the implant by pressing your fingertips over the skin where it was inserted. Contact your doctor if you cannot feel the implant, and use a non-hormonal birth control method (such as condoms) until your doctor confirms that the implant is in place. Contact your doctor if you think that the implant may have broken or become bent while in your arm. You will receive a user card from your health care provider after the implant is inserted. The card is a record of the location of the implant in your upper arm   and when it should be removed. Keep this card with your health records. What side effects may I notice from receiving this medicine? Side effects that you should report to your doctor or health care professional as soon as possible:  allergic reactions like skin rash, itching or hives, swelling of the  face, lips, or tongue  breast lumps, breast tissue changes, or discharge  breathing problems  changes in emotions or moods  coughing up blood  if you feel that the implant may have broken or bent while in your arm  high blood pressure  pain, irritation, swelling, or bruising at the insertion site  scar at site of insertion  signs of infection at the insertion site such as fever, and skin redness, pain or discharge  signs and symptoms of a blood clot such as breathing problems; changes in vision; chest pain; severe, sudden headache; pain, swelling, warmth in the leg; trouble speaking; sudden numbness or weakness of the face, arm or leg  signs and symptoms of liver injury like dark yellow or brown urine; general ill feeling or flu-like symptoms; light-colored stools; loss of appetite; nausea; right upper belly pain; unusually weak or tired; yellowing of the eyes or skin  unusual vaginal bleeding, discharge Side effects that usually do not require medical attention (report to your doctor or health care professional if they continue or are bothersome):  acne  breast pain or tenderness  headache  irregular menstrual bleeding  nausea This list may not describe all possible side effects. Call your doctor for medical advice about side effects. You may report side effects to FDA at 1-800-FDA-1088. Where should I keep my medicine? This drug is given in a hospital or clinic and will not be stored at home. NOTE: This sheet is a summary. It may not cover all possible information. If you have questions about this medicine, talk to your doctor, pharmacist, or health care provider.  2021 Elsevier/Gold Standard (2019-06-10 11:33:04)  

## 2020-12-13 NOTE — Progress Notes (Signed)
   NEXPLANON REMOVAL AND RE-INSERTION Patient name: Angela White MRN [id number]  Date of birth: Jul 07, 1996 Subjective Findings:   Angela White is a 25 y.o. G71P1001 Hispanic female being seen today for Nexplanon removal and re-insertion. Her Nexplanon was placed 12/11/17.   Patient's last menstrual period was 11/12/2020. Last pap never. Results were: N/A  Risks/benefits/side effects of Nexplanon have been discussed and her questions have been answered.  Specifically, a failure rate of 09/998 has been reported, with an increased failure rate if pt takes St. John's Wort and/or antiseizure medicaitons.  She is aware of the common side effect of irregular bleeding, which the incidence of decreases over time. Signed copy of informed consent in chart.  Depression screen [facility] 2/9 04/03/2017 03/15/2017  Decreased Interest 0 0  Down, Depressed, Hopeless 0 0  PHQ - 2 Score 0 0  Altered sleeping 1 -  Tired, decreased energy 1 -  Change in appetite 0 -  Feeling bad or failure about yourself  0 -  Trouble concentrating 0 -  Moving slowly or fidgety/restless 0 -  Suicidal thoughts 0 -  PHQ-9 Score 2 -    Pertinent History Reviewed:   Reviewed past medical,surgical, social, obstetrical and family history.  Reviewed problem list, medications and allergies. Objective Findings & Procedure:    Vitals:   12/13/20 1151  BP: 121/78  Pulse: 84  Weight: 116 lb 8 oz (52.8 kg)  Height: 5' (1.524 m)  Body mass index is 22.75 kg/m.  Results for orders placed or performed in visit on 12/13/20 (from the past 24 hour(s))  POCT urine pregnancy   Collection Time: 12/13/20 11:58 AM  Result Value Ref Range   Preg Test, Ur Negative Negative     Time out was performed.  Nexplanon site identified.  Area prepped in usual sterile fashon. Two cc's of 2% lidocaine was used to anesthetize the area. A small stab incision was made right beside the implant on the distal portion.  The Nexplanon rod was grasped  using hemostats and removed intact without difficulty.  The area was cleansed again with betadine and the Nexplanon was inserted approximately 10cm from the medial epicondyle and 3-5cm posterior to the sulcus per manufacturer's recommendations without difficulty.  Steri-strips and a pressure bandage was applied.  There was less than 3 cc blood loss. There were no complications.  The patient tolerated the procedure well. Assessment & Plan:   1) Nexplanon removal & re-insertion She was instructed to keep the area clean and dry, remove pressure bandage in 24 hours, and keep insertion site covered with the steri-strips for 3-5 days.  She was given a card indicating date Nexplanon was inserted and date it needs to be removed.  Follow-up PRN problems.  2) +CT 2020> POC today  Orders Placed This Encounter  Procedures  . GC/Chlamydia Probe Amp  . POCT urine pregnancy    Follow-up: Return for 1st available, Pap & physical; then from now for med f/u in person w/ cnm.  Cheral Marker CNM, WHNP-BC 12/13/2020 12:12 PM

## 2020-12-13 NOTE — Addendum Note (Signed)
Addended by: Colen Darling on: 12/13/2020 12:47 PM   Modules accepted: Orders

## 2020-12-14 LAB — GC/CHLAMYDIA PROBE AMP
Chlamydia trachomatis, NAA: NEGATIVE
Neisseria Gonorrhoeae by PCR: NEGATIVE

## 2020-12-16 ENCOUNTER — Encounter: Payer: Self-pay | Admitting: Physician Assistant

## 2020-12-16 ENCOUNTER — Telehealth: Payer: BC Managed Care – PPO | Admitting: Physician Assistant

## 2020-12-16 DIAGNOSIS — N3090 Cystitis, unspecified without hematuria: Secondary | ICD-10-CM | POA: Diagnosis not present

## 2020-12-16 MED ORDER — CEPHALEXIN 500 MG PO CAPS: 500.0000 mg | ORAL_CAPSULE | Freq: Two times a day (BID) | ORAL | 0 refills | Status: AC

## 2020-12-16 NOTE — Progress Notes (Signed)
Ms. kalia, vahey are scheduled for a virtual visit with your provider today.    Just as we do with appointments in the office, we must obtain your consent to participate.  Your consent will be active for this visit and any virtual visit you may have with one of our providers in the next 365 days.    If you have a MyChart account, I can also send a copy of this consent to you electronically.  All virtual visits are billed to your insurance company just like a traditional visit in the office.  As this is a virtual visit, video technology does not allow for your provider to perform a traditional examination.  This may limit your provider's ability to fully assess your condition.  If your provider identifies any concerns that need to be evaluated in person or the need to arrange testing such as labs, EKG, etc, we will make arrangements to do so.    Although advances in technology are sophisticated, we cannot ensure that it will always work on either your end or our end.  If the connection with a video visit is poor, we may have to switch to a telephone visit.  With either a video or telephone visit, we are not always able to ensure that we have a secure connection.   I need to obtain your verbal consent now.   Are you willing to proceed with your visit today?   SALLEY BOXLEY has provided verbal consent on 12/16/2020 for a virtual visit (video or telephone).   Zadyn Yardley Manfred Shirts, PA-C 12/16/2020  12:00 PM   Ms. caili, escalera are scheduled for a virtual visit with your provider today.    Just as we do with appointments in the office, we must obtain your consent to participate.  Your consent will be active for this visit and any virtual visit you may have with one of our providers in the next 365 days.    If you have a MyChart account, I can also send a copy of this consent to you electronically.  All virtual visits are billed to your insurance company just like a traditional visit in the office.  As this  is a virtual visit, video technology does not allow for your provider to perform a traditional examination.  This may limit your provider's ability to fully assess your condition.  If your provider identifies any concerns that need to be evaluated in person or the need to arrange testing such as labs, EKG, etc, we will make arrangements to do so.    Although advances in technology are sophisticated, we cannot ensure that it will always work on either your end or our end.  If the connection with a video visit is poor, we may have to switch to a telephone visit.  With either a video or telephone visit, we are not always able to ensure that we have a secure connection.   I need to obtain your verbal consent now.   Are you willing to proceed with your visit today?   SYMPHONI HELBLING has provided verbal consent on 12/16/2020 for a virtual visit (video or telephone).   Karrie Meres, PA-C 12/16/2020  12:00 PM   Date:  12/16/2020   ID:  [name], DOB 1996/04/26, MRN [id number]  Patient Location: Home Provider Location: Home Office   Participants: Patient and Provider for Visit and Wrap up  Method of visit: Video  Location of Patient: Home Location of Provider: Home Office Consent  was obtain for visit over the video. Services rendered by provider: Visit was performed via video  A video enabled telemedicine application was used and I verified that I am speaking with the correct person using two identifiers.  PCP:  Patient, No Pcp Per (Inactive)   Chief Complaint:  uti sxs  History of Present Illness:    Angela White is a 25 y.o. female with history as stated below. Presents video telehealth for an acute care visit  Onset of symptoms was yesterday and symptoms have been persistent and include:    Dysuria, frequency, urgency  She is currently on her menstrual cycle so Is not sure if she is having hematuria. Denies fevers, abd pain, vaginal discharge, vomiting, flank  pain.  Denies having fevers, chills, shortness of breath, cough, chest pain, ear pain, sore throat or exposure to covid or other sick contacts. No other aggravating or relieving factors.  No other c/o.  The patient does not have symptoms concerning for COVID-19 infection (fever, chills, cough, or new shortness of breath).   Past Medical, Surgical, Social History, Allergies, and Medications have been Reviewed.  Past Medical History:  Diagnosis Date  . Anemia     Current Meds  Medication Sig  . cephALEXin (KEFLEX) 500 MG capsule Take 1 capsule (500 mg total) by mouth 2 (two) times daily for 7 days.     Allergies:   Patient has no known allergies.   ROS See HPI for history of present illness.  Physical Exam Constitutional:      Appearance: She is not ill-appearing.  HENT:     Head: Normocephalic and atraumatic.  Eyes:     Conjunctiva/sclera: Conjunctivae normal.  Pulmonary:     Effort: Pulmonary effort is normal.  Musculoskeletal:        General: Normal range of motion.  Neurological:     Mental Status: She is alert.     Comments: alert  Psychiatric:        Mood and Affect: Mood normal.               There are no diagnoses linked to this encounter.   Time:   Today, I have spent 10 minutes with the patient with telehealth technology discussing the above problems, reviewing the chart, previous notes, medications and orders.   MDM: pt presenting with 1 day h/o uti sxs. Denies fevers, vomiting or flank pain. Has h/o uti and this feels similar. Will tx with keflex. Advised on plan for f/u and return precautions. She voices understanding and is agreement with plan.   Tests Ordered: No orders of the defined types were placed in this encounter.   Medication Changes: Meds ordered this encounter  Medications  . cephALEXin (KEFLEX) 500 MG capsule    Sig: Take 1 capsule (500 mg total) by mouth 2 (two) times daily for 7 days.    Dispense:  14 capsule    Refill:  0     Order Specific Question:   Supervising Provider    Answer:   Eber Hong [[id number]]     Disposition:  Follow up  Signed, Demetrious Rainford Manfred Shirts, PA-C  12/16/2020 12:00 PM

## 2020-12-16 NOTE — Patient Instructions (Signed)
Urinary Tract Infection, Adult  A urinary tract infection (UTI) is an infection of any part of the urinary tract. The urinary tract includes the kidneys, ureters, bladder, and urethra. These organs make, store, and get rid of urine in the body. An upper UTI affects the ureters and kidneys. A lower UTI affects the bladder and urethra. What are the causes? Most urinary tract infections are caused by bacteria in your genital area around your urethra, where urine leaves your body. These bacteria grow and cause inflammation of your urinary tract. What increases the risk? You are more likely to develop this condition if:  You have a urinary catheter that stays in place.  You are not able to control when you urinate or have a bowel movement (incontinence).  You are female and you: ? Use a spermicide or diaphragm for birth control. ? Have low estrogen levels. ? Are pregnant.  You have certain genes that increase your risk.  You are sexually active.  You take antibiotic medicines.  You have a condition that causes your flow of urine to slow down, such as: ? An enlarged prostate, if you are female. ? Blockage in your urethra. ? A kidney stone. ? A nerve condition that affects your bladder control (neurogenic bladder). ? Not getting enough to drink, or not urinating often.  You have certain medical conditions, such as: ? Diabetes. ? A weak disease-fighting system (immunesystem). ? Sickle cell disease. ? Gout. ? Spinal cord injury. What are the signs or symptoms? Symptoms of this condition include:  Needing to urinate right away (urgency).  Frequent urination. This may include small amounts of urine each time you urinate.  Pain or burning with urination.  Blood in the urine.  Urine that smells bad or unusual.  Trouble urinating.  Cloudy urine.  Vaginal discharge, if you are female.  Pain in the abdomen or the lower back. You may also have:  Vomiting or a decreased  appetite.  Confusion.  Irritability or tiredness.  A fever or chills.  Diarrhea. The first symptom in older adults may be confusion. In some cases, they may not have any symptoms until the infection has worsened. How is this diagnosed? This condition is diagnosed based on your medical history and a physical exam. You may also have other tests, including:  Urine tests.  Blood tests.  Tests for STIs (sexually transmitted infections). If you have had more than one UTI, a cystoscopy or imaging studies may be done to determine the cause of the infections. How is this treated? Treatment for this condition includes:  Antibiotic medicine.  Over-the-counter medicines to treat discomfort.  Drinking enough water to stay hydrated. If you have frequent infections or have other conditions such as a kidney stone, you may need to see a health care provider who specializes in the urinary tract (urologist). In rare cases, urinary tract infections can cause sepsis. Sepsis is a life-threatening condition that occurs when the body responds to an infection. Sepsis is treated in the hospital with IV antibiotics, fluids, and other medicines. Follow these instructions at home: Medicines  Take over-the-counter and prescription medicines only as told by your health care provider.  If you were prescribed an antibiotic medicine, take it as told by your health care provider. Do not stop using the antibiotic even if you start to feel better. General instructions  Make sure you: ? Empty your bladder often and completely. Do not hold urine for long periods of time. ? Empty your bladder after   sex. ? Wipe from front to back after urinating or having a bowel movement if you are female. Use each tissue only one time when you wipe.  Drink enough fluid to keep your urine pale yellow.  Keep all follow-up visits. This is important.   Contact a health care provider if:  Your symptoms do not get better after 1-2  days.  Your symptoms go away and then return. Get help right away if:  You have severe pain in your back or your lower abdomen.  You have a fever or chills.  You have nausea or vomiting. Summary  A urinary tract infection (UTI) is an infection of any part of the urinary tract, which includes the kidneys, ureters, bladder, and urethra.  Most urinary tract infections are caused by bacteria in your genital area.  Treatment for this condition often includes antibiotic medicines.  If you were prescribed an antibiotic medicine, take it as told by your health care provider. Do not stop using the antibiotic even if you start to feel better.  Keep all follow-up visits. This is important. This information is not intended to replace advice given to you by your health care provider. Make sure you discuss any questions you have with your health care provider. Document Revised: 04/09/2020 Document Reviewed: 04/09/2020 Elsevier Patient Education  2021 Elsevier Inc.  

## 2020-12-28 ENCOUNTER — Encounter: Payer: Self-pay | Admitting: Physician Assistant

## 2020-12-28 ENCOUNTER — Telehealth: Payer: BC Managed Care – PPO | Admitting: Physician Assistant

## 2020-12-28 DIAGNOSIS — B3731 Acute candidiasis of vulva and vagina: Secondary | ICD-10-CM

## 2020-12-28 DIAGNOSIS — B373 Candidiasis of vulva and vagina: Secondary | ICD-10-CM

## 2020-12-28 MED ORDER — FLUCONAZOLE 150 MG PO TABS
150.0000 mg | ORAL_TABLET | Freq: Once | ORAL | 0 refills | Status: AC
Start: 2020-12-28 — End: 2020-12-28

## 2020-12-28 NOTE — Patient Instructions (Signed)
Instructions sent to patients MyChart.

## 2020-12-28 NOTE — Progress Notes (Signed)
Angela White, randleman are scheduled for a virtual visit with your provider today.    Just as we do with appointments in the office, we must obtain your consent to participate.  Your consent will be active for this visit and any virtual visit you may have with one of our providers in the next 365 days.    If you have a MyChart account, I can also send a copy of this consent to you electronically.  All virtual visits are billed to your insurance company just like a traditional visit in the office.  As this is a virtual visit, video technology does not allow for your provider to perform a traditional examination.  This may limit your provider's ability to fully assess your condition.  If your provider identifies any concerns that need to be evaluated in person or the need to arrange testing such as labs, EKG, etc, we will make arrangements to do so.    Although advances in technology are sophisticated, we cannot ensure that it will always work on either your end or our end.  If the connection with a video visit is poor, we may have to switch to a telephone visit.  With either a video or telephone visit, we are not always able to ensure that we have a secure connection.   I need to obtain your verbal consent now.   Are you willing to proceed with your visit today?   Angela White has provided verbal consent on 12/28/2020 for a virtual visit (video or telephone).  Angela Climes, PA-C 12/28/2020  11:48 AM   Virtual Visit via Video   I connected with patient on 12/28/20 at 12:00 PM EDT by a video enabled telemedicine application and verified that I am speaking with the correct person using two identifiers.  Location patient: Home Location provider: [facility] Persons participating in the virtual visit: Patient, Provider  I discussed the limitations of evaluation and management by telemedicine and the availability of in person appointments. The patient expressed understanding and  agreed to proceed.  Subjective:   HPI:  Patient presents via [facility] today c/o vaginal itching x 2 days along with discharge described as White and thick.  Denies pain. Denies urinary urgency or frequency.  Denies fever, chills or aches. Has used some AZO without much improvement. Did start the one-day Monistat which helped but has not completely resolved the symptoms.   ROS:   See pertinent positives and negatives per HPI.  Patient Active Problem List   Diagnosis Date Noted  . Nexplanon insertion 12/11/2017  . Trichomonas infection 10/04/2017  . Marijuana use 04/04/2017  . Chlamydia 12/07/2014  . Irregular periods 12/01/2014  . BV (bacterial vaginosis) 12/01/2014    Social History   Tobacco Use  . Smoking status: Never Smoker  . Smokeless tobacco: Never Used  Substance Use Topics  . Alcohol use: No    Current Outpatient Medications:  .  megestrol (MEGACE) 40 MG tablet, 3x5d, 2x5d, then 1 daily to help control vaginal bleeding. Stop taking 1wk after bleeding stops. (Patient not taking: Reported on 12/13/2020), Disp: 45 tablet, Rfl: 1  No Known Allergies  Objective:   There were no vitals taken for this visit.  Patient is well-developed, well-nourished in no acute distress.  Resting comfortably at home.  Head is normocephalic, atraumatic.  No labored breathing.  Speech is clear and coherent with logical content.  Patient is alert and oriented at baseline.   Assessment and Plan:  Angela Climes, PA-C 12/28/2020

## 2021-01-17 ENCOUNTER — Other Ambulatory Visit: Payer: BC Managed Care – PPO | Admitting: Women's Health

## 2021-01-21 DIAGNOSIS — F411 Generalized anxiety disorder: Secondary | ICD-10-CM | POA: Diagnosis not present

## 2021-02-18 ENCOUNTER — Encounter: Payer: Self-pay | Admitting: Physician Assistant

## 2021-02-18 ENCOUNTER — Telehealth: Payer: BC Managed Care – PPO | Admitting: Physician Assistant

## 2021-02-18 DIAGNOSIS — B373 Candidiasis of vulva and vagina: Secondary | ICD-10-CM | POA: Diagnosis not present

## 2021-02-18 DIAGNOSIS — B3731 Acute candidiasis of vulva and vagina: Secondary | ICD-10-CM

## 2021-02-18 MED ORDER — FLUCONAZOLE 150 MG PO TABS: 150.0000 mg | ORAL_TABLET | Freq: Once | ORAL | 0 refills | Status: AC

## 2021-02-18 NOTE — Progress Notes (Signed)
[name], [name] are scheduled for a virtual visit with your provider today.    Just as we do with appointments in the office, we must obtain your consent to participate.  Your consent will be active for this visit and any virtual visit you may have with one of our providers in the next 365 days.    If you have a MyChart account, I can also send a copy of this consent to you electronically.  All virtual visits are billed to your insurance company just like a traditional visit in the office.  As this is a virtual visit, video technology does not allow for your provider to perform a traditional examination.  This may limit your provider's ability to fully assess your condition.  If your provider identifies any concerns that need to be evaluated in person or the need to arrange testing such as labs, EKG, etc, we will make arrangements to do so.    Although advances in technology are sophisticated, we cannot ensure that it will always work on either your end or our end.  If the connection with a video visit is poor, we may have to switch to a telephone visit.  With either a video or telephone visit, we are not always able to ensure that we have a secure connection.   I need to obtain your verbal consent now.   Are you willing to proceed with your visit today?   Angela [name] has provided verbal consent on 02/18/2021 for a virtual visit (video or telephone).   Angela Loveless, PA-C 02/18/2021  10:49 AM    MyChart Video Visit    Virtual Visit via Video Note   This visit type was conducted due to national recommendations for restrictions regarding the COVID-19 Pandemic (e.g. social distancing) in an effort to limit this patient's exposure and mitigate transmission in our community. This patient is at least at moderate risk for complications without adequate follow up. This format is felt to be most appropriate for this patient at this time. Physical exam was limited by quality of the video and  audio technology used for the visit.   Patient location: Home Provider location: Home office in [place]  I discussed the limitations of evaluation and management by telemedicine and the availability of in person appointments. The patient expressed understanding and agreed to proceed.  [name]   DOB: 02/02/1996   25 y.o. Female  MRN: [id number] Visit Date: 02/18/2021  Today's healthcare provider: Margaretann Loveless, PA-C   No chief complaint on file.  Subjective    Vaginal Discharge The patient's primary symptoms include genital itching and vaginal discharge. The patient's pertinent negatives include no pelvic pain. This is a recurrent problem. The current episode started yesterday. The problem occurs intermittently. The problem has been unchanged. The patient is experiencing no pain. She is not pregnant. Pertinent negatives include no dysuria or frequency. The vaginal discharge was copious, thick and white. There has been no bleeding. Nothing aggravates the symptoms. She has tried nothing for the symptoms. She is sexually active. No, her partner does not have an STD. Contraceptive use: nexplanon.    Patient Active Problem List   Diagnosis Date Noted   Nexplanon insertion 12/11/2017   Trichomonas infection 10/04/2017   Marijuana use 04/04/2017   Chlamydia 12/07/2014   Irregular periods 12/01/2014   BV (bacterial vaginosis) 12/01/2014   Past Medical History:  Diagnosis Date   Anemia       Medications: No  outpatient medications prior to visit.   No facility-administered medications prior to visit.    Review of Systems  Constitutional: Negative.   Gastrointestinal: Negative.   Genitourinary:  Positive for vaginal discharge. Negative for dyspareunia, dysuria, frequency, genital sores, menstrual problem and pelvic pain.   Last CBC Lab Results  Component Value Date   WBC 15.7 (H) 10/28/2017   HGB 9.4 (L) 10/28/2017   HCT 28.5 (L) 10/28/2017   MCV  81.0 10/28/2017   MCH 26.7 10/28/2017   RDW 15.6 (H) 10/28/2017   PLT 181 10/28/2017   Last metabolic panel No results found for: GLUCOSE, NA, K, CL, CO2, BUN, CREATININE, GFRNONAA, GFRAA, CALCIUM, PHOS, PROT, ALBUMIN, LABGLOB, AGRATIO, BILITOT, ALKPHOS, AST, ALT, ANIONGAP    Objective    There were no vitals taken for this visit. BP Readings from Last 3 Encounters:  12/13/20 121/78  07/22/19 117/75  12/11/17 106/60   Wt Readings from Last 3 Encounters:  12/13/20 116 lb 8 oz (52.8 kg)  07/22/19 116 lb 8 oz (52.8 kg)  12/11/17 124 lb (56.2 kg)      Physical Exam Vitals reviewed.  Constitutional:      General: She is not in acute distress.    Appearance: Normal appearance. She is well-developed. She is not ill-appearing.  HENT:     Head: Normocephalic and atraumatic.  Pulmonary:     Effort: Pulmonary effort is normal. No respiratory distress.  Musculoskeletal:     Cervical back: Normal range of motion and neck supple.  Neurological:     Mental Status: She is alert.  Psychiatric:        Mood and Affect: Mood normal.        Behavior: Behavior normal.        Thought Content: Thought content normal.        Judgment: Judgment normal.       Assessment & Plan     1. Yeast vaginitis - Symptoms c/w yeast vaginitis - Diflucan prescribed as below - Seek in-person evaluation if not improving or worsening - fluconazole (DIFLUCAN) 150 MG tablet; Take 1 tablet (150 mg total) by mouth once for 1 dose. May repeat in 72 hours if needed  Dispense: 2 tablet; Refill: 0   No follow-ups on file.     I discussed the assessment and treatment plan with the patient. The patient was provided an opportunity to ask questions and all were answered. The patient agreed with the plan and demonstrated an understanding of the instructions.   The patient was advised to call back or seek an in-person evaluation if the symptoms worsen or if the condition fails to improve as anticipated.  I  provided 8 minutes of face-to-face time during this encounter via MyChart Video enabled encounter.   [facility] [phone] (phone) [phone] (fax)  [name]

## 2021-02-18 NOTE — Patient Instructions (Signed)
Vaginal Yeast Infection, Adult  Vaginal yeast infection is a condition that causes vaginal discharge as well as soreness, swelling, and redness (inflammation) of the vagina. This is a common condition. Some women get this infectionfrequently. What are the causes? This condition is caused by a change in the normal balance of the yeast (candida) and bacteria that live in the vagina. This change causes an overgrowth ofyeast, which causes the inflammation. What increases the risk? The condition is more likely to develop in women who: Take antibiotic medicines. Have diabetes. Take birth control pills. Are pregnant. Douche often. Have a weak body defense system (immune system). Have been taking steroid medicines for a long time. Frequently wear tight clothing. What are the signs or symptoms? Symptoms of this condition include: [name], thick, creamy vaginal discharge. Swelling, itching, redness, and irritation of the vagina. The lips of the vagina (vulva) may be affected as well. Pain or a burning feeling while urinating. Pain during sex. How is this diagnosed? This condition is diagnosed based on: Your medical history. A physical exam. A pelvic exam. Your health care provider will examine a sample of your vaginal discharge under a microscope. Your health care provider may send this sample for testing to confirm the diagnosis. How is this treated? This condition is treated with medicine. Medicines may be over-the-counter or prescription. You may be told to use one or more of the following: Medicine that is taken by mouth (orally). Medicine that is applied as a cream (topically). Medicine that is inserted directly into the vagina (suppository). Follow these instructions at home:  Lifestyle Do not have sex until your health care provider approves. Tell your sex partner that you have a yeast infection. That person should go to his or her health care provider and ask if they should also be  treated. Do not wear tight clothes, such as pantyhose or tight pants. Wear breathable cotton underwear. General instructions Take or apply over-the-counter and prescription medicines only as told by your health care provider. Eat more yogurt. This may help to keep your yeast infection from returning. Do not use tampons until your health care provider approves. Try taking a sitz bath to help with discomfort. This is a warm water bath that is taken while you are sitting down. The water should only come up to your hips and should cover your buttocks. Do this 3-4 times per day or as told by your health care provider. Do not douche. If you have diabetes, keep your blood sugar levels under control. Keep all follow-up visits as told by your health care provider. This is important. Contact a health care provider if: You have a fever. Your symptoms go away and then return. Your symptoms do not get better with treatment. Your symptoms get worse. You have new symptoms. You develop blisters in or around your vagina. You have blood coming from your vagina and it is not your menstrual period. You develop pain in your abdomen. Summary Vaginal yeast infection is a condition that causes discharge as well as soreness, swelling, and redness (inflammation) of the vagina. This condition is treated with medicine. Medicines may be over-the-counter or prescription. Take or apply over-the-counter and prescription medicines only as told by your health care provider. Do not douche. Do not have sex or use tampons until your health care provider approves. Contact a health care provider if your symptoms do not get better with treatment or your symptoms go away and then return. This information is not intended to replace   advice given to you by your health care provider. Make sure you discuss any questions you have with your healthcare provider. Document Revised: 08/18/2020 Document Reviewed: 01/14/2018 Elsevier Patient  Education  2022 Elsevier Inc.  

## 2021-05-24 ENCOUNTER — Ambulatory Visit (INDEPENDENT_AMBULATORY_CARE_PROVIDER_SITE_OTHER): Payer: BC Managed Care – PPO | Admitting: Women's Health

## 2021-05-24 ENCOUNTER — Other Ambulatory Visit (HOSPITAL_COMMUNITY)
Admission: RE | Admit: 2021-05-24 | Discharge: 2021-05-24 | Disposition: A | Discharge status: Home / Self Care | Payer: BC Managed Care – PPO | Source: Ambulatory Visit | Attending: Women's Health | Admitting: Women's Health

## 2021-05-24 ENCOUNTER — Other Ambulatory Visit: Payer: Self-pay

## 2021-05-24 ENCOUNTER — Encounter: Payer: Self-pay | Admitting: Women's Health

## 2021-05-24 VITALS — BP 121/77 | HR 77 | Ht 60.0 in | Wt 127.0 lb

## 2021-05-24 DIAGNOSIS — N921 Excessive and frequent menstruation with irregular cycle: Secondary | ICD-10-CM

## 2021-05-24 DIAGNOSIS — Z975 Presence of (intrauterine) contraceptive device: Secondary | ICD-10-CM

## 2021-05-24 DIAGNOSIS — Z113 Encounter for screening for infections with a predominantly sexual mode of transmission: Secondary | ICD-10-CM | POA: Insufficient documentation

## 2021-05-24 DIAGNOSIS — Z01419 Encounter for gynecological examination (general) (routine) without abnormal findings: Secondary | ICD-10-CM | POA: Diagnosis not present

## 2021-05-24 DIAGNOSIS — Z124 Encounter for screening for malignant neoplasm of cervix: Secondary | ICD-10-CM | POA: Diagnosis not present

## 2021-05-24 MED ORDER — MEGESTROL ACETATE 40 MG PO TABS: ORAL_TABLET | ORAL | 1 refills | Status: DC

## 2021-05-24 NOTE — Progress Notes (Signed)
WELL-WOMAN EXAMINATION Patient name: Angela White MRN [id number]  Date of birth: 25-Mar-1996 Chief Complaint:   Annual Exam  History of Present Illness:   Angela White is a 25 y.o. G79P1001 Hispanic female being seen today for a routine well-woman exam.  Current complaints: periods 2x/mth x 1-1.5wks, megace helps, needs refill Denies abnormal discharge, itching/odor/irritation.    PCP: none      does not desire labs No LMP recorded. Patient has had an implant. The current method of family planning is Nexplanon.  Last pap never. Results were: N/A. H/O abnormal pap: no Last mammogram: never. Results were: N/A. Family h/o breast cancer: no Last colonoscopy: never. Results were: N/A. Family h/o colorectal cancer: no  Depression screen [facility] 2/9 05/24/2021 04/03/2017 03/15/2017  Decreased Interest 0 0 0  Down, Depressed, Hopeless 1 0 0  PHQ - 2 Score 1 0 0  Altered sleeping 1 1 -  Tired, decreased energy 1 1 -  Change in appetite 1 0 -  Feeling bad or failure about yourself  1 0 -  Trouble concentrating 1 0 -  Moving slowly or fidgety/restless 1 0 -  Suicidal thoughts 0 0 -  PHQ-9 Score 7 2 -     GAD 7 : Generalized Anxiety Score 05/24/2021  Nervous, Anxious, on Edge 1  Control/stop worrying 1  Worry too much - different things 1  Trouble relaxing 1  Restless 1  Easily annoyed or irritable 1  Afraid - awful might happen 1  Total GAD 7 Score 7     Review of Systems:   Pertinent items are noted in HPI Denies any headaches, blurred vision, fatigue, shortness of breath, chest pain, abdominal pain, abnormal vaginal discharge/itching/odor/irritation, problems with periods, bowel movements, urination, or intercourse unless otherwise stated above. Pertinent History Reviewed:  Reviewed past medical,surgical, social and family history.  Reviewed problem list, medications and allergies. Physical Assessment:   Vitals:   05/24/21 1008  BP: 121/77  Pulse: 77  Weight: 127  lb (57.6 kg)  Height: 5' (1.524 m)  Body mass index is 24.8 kg/m.        Physical Examination:   General appearance - well appearing, and in no distress  Mental status - alert, oriented to person, place, and time  Psych:  She has a normal mood and affect  Skin - warm and dry, normal color, no suspicious lesions noted  Chest - effort normal, all lung fields clear to auscultation bilaterally  Heart - normal rate and regular rhythm  Neck:  midline trachea, no thyromegaly or nodules  Breasts - breasts appear normal, no suspicious masses, no skin or nipple changes or  axillary nodes  Abdomen - soft, nontender, nondistended, no masses or organomegaly  Pelvic - VULVA: normal appearing vulva with no masses, tenderness or lesions  VAGINA: normal appearing vagina with normal color and discharge, no lesions  CERVIX: normal appearing cervix without discharge or lesions, no CMT, cx friable  Thin prep pap is done w/ HR HPV cotesting  UTERUS: uterus is felt to be normal size, shape, consistency and nontender   ADNEXA: No adnexal masses or tenderness noted.  Extremities:  No swelling or varicosities noted  Chaperone: Latisha Cresenzo    No results found for this or any previous visit (from the past 24 hour(s)).  Assessment & Plan:  1) Well-Woman Exam  2) BTB on Nexplanon> check gc/ct/trich on pap, refilled megace  3) STD screen  Labs/procedures today: pap  Mammogram: @ 24yo,  or sooner if problems Colonoscopy: @ 24yo, or sooner if problems  No orders of the defined types were placed in this encounter.   Meds:  Meds ordered this encounter  Medications   megestrol (MEGACE) 40 MG tablet    Sig: 3x5d, 2x5d, then 1 daily to help control vaginal bleeding. Stop taking when bleeding stops.    Dispense:  45 tablet    Refill:  1    Order Specific Question:   Supervising Provider    Answer:   Lazaro Arms [[id number]]     Follow-up: Return in about 1 year (around 05/24/2022) for  Physical.  Cheral Marker CNM, WHNP-BC 05/24/2021 10:24 AM

## 2021-05-27 LAB — CYTOLOGY - PAP
Chlamydia: NEGATIVE
Comment: NEGATIVE
Comment: NEGATIVE
Comment: NORMAL
Diagnosis: NEGATIVE
Neisseria Gonorrhea: NEGATIVE
Trichomonas: NEGATIVE

## 2021-08-13 ENCOUNTER — Ambulatory Visit: Payer: Self-pay

## 2021-12-26 ENCOUNTER — Telehealth: Payer: BC Managed Care – PPO | Admitting: Physician Assistant

## 2021-12-26 DIAGNOSIS — J019 Acute sinusitis, unspecified: Secondary | ICD-10-CM | POA: Diagnosis not present

## 2021-12-26 DIAGNOSIS — B9789 Other viral agents as the cause of diseases classified elsewhere: Secondary | ICD-10-CM | POA: Diagnosis not present

## 2021-12-26 MED ORDER — IPRATROPIUM BROMIDE 0.03 % NA SOLN: 2.0000 | Freq: Two times a day (BID) | NASAL | 0 refills | Status: DC

## 2021-12-26 MED ORDER — FLUTICASONE PROPIONATE 50 MCG/ACT NA SUSP: 2.0000 | Freq: Every day | NASAL | 0 refills | Status: DC

## 2021-12-26 NOTE — Progress Notes (Signed)
E-Visit for Sinus Problems ? ?We are sorry that you are not feeling well.  Here is how we plan to help! ? ?Based on what you have shared with me it looks like you have sinusitis.  Sinusitis is inflammation and infection in the sinus cavities of the head.  Based on your presentation I believe you most likely have Acute Viral Sinusitis.This is an infection most likely caused by a virus. There is not specific treatment for viral sinusitis other than to help you with the symptoms until the infection runs its course.  You may use an oral decongestant such as Mucinex D or if you have glaucoma or high blood pressure use plain Mucinex. Saline nasal spray help and can safely be used as often as needed for congestion, I have prescribed: Fluticasone nasal spray two sprays in each nostril once a day and Ipratropium Bromide nasal spray 0.03% 2 sprays in eah nostril 2-3 times a day ? ?Continue daily antihistamine (Zyrtec, Claritin, Allegra, or Xyzal) ? ?Some authorities believe that zinc sprays or the use of Echinacea may shorten the course of your symptoms. ? ?Sinus infections are not as easily transmitted as other respiratory infection, however we still recommend that you avoid close contact with loved ones, especially the very young and elderly.  Remember to wash your hands thoroughly throughout the day as this is the number one way to prevent the spread of infection! ? ?Home Care: ?Only take medications as instructed by your medical team. ?Do not take these medications with alcohol. ?A steam or ultrasonic humidifier can help congestion.  You can place a towel over your head and breathe in the steam from hot water coming from a faucet. ?Avoid close contacts especially the very young and the elderly. ?Cover your mouth when you cough or sneeze. ?Always remember to wash your hands. ? ?Get Help Right Away If: ?You develop worsening fever or sinus pain. ?You develop a severe head ache or visual changes. ?Your symptoms persist after  you have completed your treatment plan. ? ?Make sure you ?Understand these instructions. ?Will watch your condition. ?Will get help right away if you are not doing well or get worse. ? ? ?Thank you for choosing an e-visit. ? ?Your e-visit answers were reviewed by a board certified advanced clinical practitioner to complete your personal care plan. Depending upon the condition, your plan could have included both over the counter or prescription medications. ? ?Please review your pharmacy choice. Make sure the pharmacy is open so you can pick up prescription now. If there is a problem, you may contact your provider through [employer] and have the prescription routed to another pharmacy.  Your safety is important to [place]. If you have drug allergies check your prescription carefully.  ? ?For the next 24 hours you can use MyChart to ask questions about today's visit, request a non-urgent call back, or ask for a work or school excuse. ?You will get an email in the next two days asking about your experience. I hope that your e-visit has been valuable and will speed your recovery. ? ?I provided 5 minutes of non face-to-face time during this encounter for chart review and documentation.  ? ?

## 2021-12-29 ENCOUNTER — Ambulatory Visit: Payer: Self-pay

## 2022-01-02 ENCOUNTER — Other Ambulatory Visit: Payer: Self-pay | Admitting: Women's Health

## 2022-01-04 ENCOUNTER — Telehealth: Payer: Self-pay | Admitting: Women's Health

## 2022-01-04 MED ORDER — MEGESTROL ACETATE 40 MG PO TABS: ORAL_TABLET | ORAL | 1 refills | Status: DC

## 2022-01-04 NOTE — Telephone Encounter (Signed)
Patient requesting refill on Megace. She is still having breakthrough bleeding with the Nexplanon.  Advised patient to check back with pharmacy later this afternoon if she did not hear from our office.  ?

## 2022-01-04 NOTE — Addendum Note (Signed)
Addended by: Cyril Mourning A on: 01/04/2022 04:55 PM ? ? Modules accepted: Orders ? ?

## 2022-01-04 NOTE — Telephone Encounter (Signed)
Refilled megace  

## 2022-01-04 NOTE — Telephone Encounter (Signed)
Patient's pharmacy sent in a medication refill request. She called to check on the status. Please advise.  ?

## 2022-06-12 ENCOUNTER — Telehealth: Payer: BC Managed Care – PPO | Admitting: Physician Assistant

## 2022-06-12 DIAGNOSIS — R3989 Other symptoms and signs involving the genitourinary system: Secondary | ICD-10-CM

## 2022-06-12 MED ORDER — CEPHALEXIN 500 MG PO CAPS: 500.0000 mg | ORAL_CAPSULE | Freq: Two times a day (BID) | ORAL | 0 refills | Status: DC

## 2022-06-12 NOTE — Patient Instructions (Signed)
Angela White, thank you for joining Margaretann Loveless, PA-C for today's virtual visit.  While this provider is not your primary care provider (PCP), if your PCP is located in our provider database this encounter information will be shared with them immediately following your visit.  Consent: (Patient) Angela White provided verbal consent for this virtual visit at the beginning of the encounter.  Current Medications:  Current Outpatient Medications:    cephALEXin (KEFLEX) 500 MG capsule, Take 1 capsule (500 mg total) by mouth 2 (two) times daily., Disp: 14 capsule, Rfl: 0   fluticasone (FLONASE) 50 MCG/ACT nasal spray, Place 2 sprays into both nostrils daily., Disp: 16 g, Rfl: 0   ipratropium (ATROVENT) 0.03 % nasal spray, Place 2 sprays into both nostrils every 12 (twelve) hours., Disp: 30 mL, Rfl: 0   megestrol (MEGACE) 40 MG tablet, 3x5d, 2x5d, then 1 daily to help control vaginal bleeding. Stop taking when bleeding stops., Disp: 45 tablet, Rfl: 1   Medications ordered in this encounter:  Meds ordered this encounter  Medications   cephALEXin (KEFLEX) 500 MG capsule    Sig: Take 1 capsule (500 mg total) by mouth 2 (two) times daily.    Dispense:  14 capsule    Refill:  0    Order Specific Question:   Supervising Provider    Answer:   Merrilee Jansky [id number]     *If you need refills on other medications prior to your next appointment, please contact your pharmacy*  Follow-Up: Call back or seek an in-person evaluation if the symptoms worsen or if the condition fails to improve as anticipated.  [facility] [phone]  Other Instructions Urinary Tract Infection, Adult  A urinary tract infection (UTI) is an infection of any part of the urinary tract. The urinary tract includes the kidneys, ureters, bladder, and urethra. These organs make, store, and get rid of urine in the body. An upper UTI affects the ureters and kidneys. A lower UTI affects the  bladder and urethra. What are the causes? Most urinary tract infections are caused by bacteria in your genital area around your urethra, where urine leaves your body. These bacteria grow and cause inflammation of your urinary tract. What increases the risk? You are more likely to develop this condition if: You have a urinary catheter that stays in place. You are not able to control when you urinate or have a bowel movement (incontinence). You are female and you: Use a spermicide or diaphragm for birth control. Have low estrogen levels. Are pregnant. You have certain genes that increase your risk. You are sexually active. You take antibiotic medicines. You have a condition that causes your flow of urine to slow down, such as: An enlarged prostate, if you are female. Blockage in your urethra. A kidney stone. A nerve condition that affects your bladder control (neurogenic bladder). Not getting enough to drink, or not urinating often. You have certain medical conditions, such as: Diabetes. A weak disease-fighting system (immunesystem). Sickle cell disease. Gout. Spinal cord injury. What are the signs or symptoms? Symptoms of this condition include: Needing to urinate right away (urgency). Frequent urination. This may include small amounts of urine each time you urinate. Pain or burning with urination. Blood in the urine. Urine that smells bad or unusual. Trouble urinating. Cloudy urine. Vaginal discharge, if you are female. Pain in the abdomen or the lower back. You may also have: Vomiting or a decreased appetite. Confusion. Irritability or tiredness. A  fever or chills. Diarrhea. The first symptom in older adults may be confusion. In some cases, they may not have any symptoms until the infection has worsened. How is this diagnosed? This condition is diagnosed based on your medical history and a physical exam. You may also have other tests, including: Urine tests. Blood  tests. Tests for STIs (sexually transmitted infections). If you have had more than one UTI, a cystoscopy or imaging studies may be done to determine the cause of the infections. How is this treated? Treatment for this condition includes: Antibiotic medicine. Over-the-counter medicines to treat discomfort. Drinking enough water to stay hydrated. If you have frequent infections or have other conditions such as a kidney stone, you may need to see a health care provider who specializes in the urinary tract (urologist). In rare cases, urinary tract infections can cause sepsis. Sepsis is a life-threatening condition that occurs when the body responds to an infection. Sepsis is treated in the hospital with IV antibiotics, fluids, and other medicines. Follow these instructions at home:  Medicines Take over-the-counter and prescription medicines only as told by your health care provider. If you were prescribed an antibiotic medicine, take it as told by your health care provider. Do not stop using the antibiotic even if you start to feel better. General instructions Make sure you: Empty your bladder often and completely. Do not hold urine for long periods of time. Empty your bladder after sex. Wipe from front to back after urinating or having a bowel movement if you are female. Use each tissue only one time when you wipe. Drink enough fluid to keep your urine pale yellow. Keep all follow-up visits. This is important. Contact a health care provider if: Your symptoms do not get better after 1-2 days. Your symptoms go away and then return. Get help right away if: You have severe pain in your back or your lower abdomen. You have a fever or chills. You have nausea or vomiting. Summary A urinary tract infection (UTI) is an infection of any part of the urinary tract, which includes the kidneys, ureters, bladder, and urethra. Most urinary tract infections are caused by bacteria in your genital  area. Treatment for this condition often includes antibiotic medicines. If you were prescribed an antibiotic medicine, take it as told by your health care provider. Do not stop using the antibiotic even if you start to feel better. Keep all follow-up visits. This is important. This information is not intended to replace advice given to you by your health care provider. Make sure you discuss any questions you have with your health care provider. Document Revised: 04/09/2020 Document Reviewed: 04/09/2020 Elsevier Patient Education  [place].    If you have been instructed to have an in-person evaluation today at a local Urgent Care facility, please use the link below. It will take you to a list of all of our available [facility], including address, phone number and hours of operation. Please do not delay care.  [facility]  If you or a family member do not have a primary care provider, use the link below to schedule a visit and establish care. When you choose a [facility] primary care physician or advanced practice provider, you gain a long-term partner in health. Find a Primary Care Provider  Learn more about [facility] in-office and virtual care options: [facility]

## 2022-06-12 NOTE — Progress Notes (Signed)
Virtual Visit Consent   Angela White, you are scheduled for a virtual visit with a [place] provider today. Just as with appointments in the office, your consent must be obtained to participate. Your consent will be active for this visit and any virtual visit you may have with one of our providers in the next 365 days. If you have a MyChart account, a copy of this consent can be sent to you electronically.  As this is a virtual visit, video technology does not allow for your provider to perform a traditional examination. This may limit your provider's ability to fully assess your condition. If your provider identifies any concerns that need to be evaluated in person or the need to arrange testing (such as labs, EKG, etc.), we will make arrangements to do so. Although advances in technology are sophisticated, we cannot ensure that it will always work on either your end or our end. If the connection with a video visit is poor, the visit may have to be switched to a telephone visit. With either a video or telephone visit, we are not always able to ensure that we have a secure connection.  By engaging in this virtual visit, you consent to the provision of healthcare and authorize for your insurance to be billed (if applicable) for the services provided during this visit. Depending on your insurance coverage, you may receive a charge related to this service.  I need to obtain your verbal consent now. Are you willing to proceed with your visit today? DEMETRIS MEINHARDT has provided verbal consent on 06/12/2022 for a virtual visit (video or telephone). Mar Daring, PA-C  Date: 06/12/2022 1:01 PM  Virtual Visit via Video Note   I, Mar Daring, connected with  Angela DEVARGAS  ([id number], 07-16-1996) on 06/12/22 at  1:00 PM EDT by a video-enabled telemedicine application and verified that I am speaking with the correct person using two identifiers.  Location: Patient: Virtual  Visit Location Patient: Home Provider: Virtual Visit Location Provider: Home Office   I discussed the limitations of evaluation and management by telemedicine and the availability of in person appointments. The patient expressed understanding and agreed to proceed.    History of Present Illness: Angela White is a 26 y.o. who identifies as a female who was assigned female at birth, and is being seen today for Dysuria.  HPI: Dysuria  This is a new problem. The current episode started today (has had diarrhea since Friday). The problem occurs every urination. The problem has been gradually worsening. The quality of the pain is described as aching and burning. The pain is mild. There has been no fever. She is Not sexually active. There is No history of pyelonephritis. Associated symptoms include frequency, hesitancy, nausea (mild, last night) and urgency. Pertinent negatives include no chills, discharge, flank pain, hematuria or vomiting. She has tried increased fluids and NSAIDs for the symptoms. The treatment provided no relief. There is no history of catheterization or recurrent UTIs.      Problems:  Patient Active Problem List   Diagnosis Date Noted   Nexplanon insertion 12/11/2017   Trichomonas infection 10/04/2017   Marijuana use 04/04/2017   Chlamydia 12/07/2014   Irregular periods 12/01/2014   BV (bacterial vaginosis) 12/01/2014    Allergies: No Known Allergies Medications:  Current Outpatient Medications:    cephALEXin (KEFLEX) 500 MG capsule, Take 1 capsule (500 mg total) by mouth 2 (two) times daily., Disp: 14 capsule, Rfl: 0  fluticasone (FLONASE) 50 MCG/ACT nasal spray, Place 2 sprays into both nostrils daily., Disp: 16 g, Rfl: 0   ipratropium (ATROVENT) 0.03 % nasal spray, Place 2 sprays into both nostrils every 12 (twelve) hours., Disp: 30 mL, Rfl: 0   megestrol (MEGACE) 40 MG tablet, 3x5d, 2x5d, then 1 daily to help control vaginal bleeding. Stop taking when bleeding  stops., Disp: 45 tablet, Rfl: 1  Observations/Objective: Patient is well-developed, well-nourished in no acute distress.  Resting comfortably at home.  Head is normocephalic, atraumatic.  No labored breathing.  Speech is clear and coherent with logical content.  Patient is alert and oriented at baseline.    Assessment and Plan: 1. Suspected UTI - cephALEXin (KEFLEX) 500 MG capsule; Take 1 capsule (500 mg total) by mouth 2 (two) times daily.  Dispense: 14 capsule; Refill: 0  - Worsening symptoms.  - Will treat empirically with Keflex - May use AZO for bladder spasms - Continue to push fluids.  - Seek in person evaluation for urine culture if symptoms do not improve or if they worsen.    Follow Up Instructions: I discussed the assessment and treatment plan with the patient. The patient was provided an opportunity to ask questions and all were answered. The patient agreed with the plan and demonstrated an understanding of the instructions.  A copy of instructions were sent to the patient via MyChart unless otherwise noted below.    The patient was advised to call back or seek an in-person evaluation if the symptoms worsen or if the condition fails to improve as anticipated.  Time:  I spent 8 minutes with the patient via telehealth technology discussing the above problems/concerns.    Margaretann Loveless, PA-C

## 2022-08-08 ENCOUNTER — Other Ambulatory Visit: Payer: Self-pay | Admitting: Adult Health

## 2022-08-08 ENCOUNTER — Telehealth: Payer: Self-pay

## 2022-08-08 ENCOUNTER — Other Ambulatory Visit: Payer: Self-pay | Admitting: Women's Health

## 2022-08-08 MED ORDER — MEGESTROL ACETATE 40 MG PO TABS: ORAL_TABLET | ORAL | 0 refills | Status: DC

## 2022-08-08 NOTE — Telephone Encounter (Signed)
Patient called and needs a refill on her Megestrol.  Patient would like for someone to call her when its done or send her a Mychart message

## 2022-08-08 NOTE — Telephone Encounter (Signed)
Pt needs a refill on Megace. Call transferred to [facility] for physical appt. Thanks! JSY

## 2022-08-21 ENCOUNTER — Other Ambulatory Visit: Payer: BC Managed Care – PPO | Admitting: Advanced Practice Midwife

## 2023-06-19 ENCOUNTER — Telehealth: Payer: BC Managed Care – PPO | Admitting: Physician Assistant

## 2023-06-19 DIAGNOSIS — R6889 Other general symptoms and signs: Secondary | ICD-10-CM

## 2023-06-19 DIAGNOSIS — Z20828 Contact with and (suspected) exposure to other viral communicable diseases: Secondary | ICD-10-CM | POA: Diagnosis not present

## 2023-06-19 MED ORDER — ALBUTEROL SULFATE HFA 108 (90 BASE) MCG/ACT IN AERS: 2.0000 | INHALATION_SPRAY | Freq: Four times a day (QID) | RESPIRATORY_TRACT | 0 refills | Status: AC | PRN

## 2023-06-19 MED ORDER — OSELTAMIVIR PHOSPHATE 75 MG PO CAPS
75.0000 mg | ORAL_CAPSULE | Freq: Two times a day (BID) | ORAL | 0 refills | Status: DC
Start: 2023-06-19 — End: 2024-01-30

## 2023-06-19 NOTE — Progress Notes (Signed)
Virtual Visit Consent   Angela White, you are scheduled for a virtual visit with a [facility] provider today. Just as with appointments in the office, your consent must be obtained to participate. Your consent will be active for this visit and any virtual visit you may have with one of our providers in the next 365 days. If you have a MyChart account, a copy of this consent can be sent to you electronically.  As this is a virtual visit, video technology does not allow for your provider to perform a traditional examination. This may limit your provider's ability to fully assess your condition. If your provider identifies any concerns that need to be evaluated in person or the need to arrange testing (such as labs, EKG, etc.), we will make arrangements to do so. Although advances in technology are sophisticated, we cannot ensure that it will always work on either your end or our end. If the connection with a video visit is poor, the visit may have to be switched to a telephone visit. With either a video or telephone visit, we are not always able to ensure that we have a secure connection.  By engaging in this virtual visit, you consent to the provision of healthcare and authorize for your insurance to be billed (if applicable) for the services provided during this visit. Depending on your insurance coverage, you may receive a charge related to this service.  I need to obtain your verbal consent now. Are you willing to proceed with your visit today? Angela White has provided verbal consent on 06/19/2023 for a virtual visit (video or telephone). Piedad Climes, [place]  Date: 06/19/2023 12:39 PM  Virtual Visit via Video Note   I, Piedad Climes, connected with  Angela White  ([id number], 1996-02-12) on 06/19/23 at  1:30 PM EDT by a video-enabled telemedicine application and verified that I am speaking with the correct person using two identifiers.  Location: Patient: Virtual  Visit Location Patient: Home Provider: Virtual Visit Location Provider: Home Office   I discussed the limitations of evaluation and management by telemedicine and the availability of in person appointments. The patient expressed understanding and agreed to proceed.    History of Present Illness: Angela White is a 27 y.o. who identifies as a female who was assigned female at birth, and is being seen today for flu-like symptoms. Endorses symptoms starting yesterday with headache, chills, aches, nasal congestion and chest tightness. Also noting low grade fever. Notes her little brother tested positive for flu A this morning. Denies lightheadedness/dizziness. Cough is dry but persistent.  OTC -- Tylenol  HPI: HPI  Problems:  Patient Active Problem List   Diagnosis Date Noted   Nexplanon insertion 12/11/2017   Trichomonas infection 10/04/2017   Marijuana use 04/04/2017   Chlamydia 12/07/2014   Irregular periods 12/01/2014   BV (bacterial vaginosis) 12/01/2014    Allergies: No Known Allergies Medications:  Current Outpatient Medications:    albuterol (VENTOLIN HFA) 108 (90 Base) MCG/ACT inhaler, Inhale 2 puffs into the lungs every 6 (six) hours as needed for wheezing or shortness of breath., Disp: 8 g, Rfl: 0   oseltamivir (TAMIFLU) 75 MG capsule, Take 1 capsule (75 mg total) by mouth 2 (two) times daily., Disp: 10 capsule, Rfl: 0  Observations/Objective: Patient is well-developed, well-nourished in no acute distress.  Resting comfortably at home.  Head is normocephalic, atraumatic.  No labored breathing. Speech is clear and coherent with logical content.  Patient is  alert and oriented at baseline.   Assessment and Plan: 1. Exposure to the flu  2. Flu-like symptoms - albuterol (VENTOLIN HFA) 108 (90 Base) MCG/ACT inhaler; Inhale 2 puffs into the lungs every 6 (six) hours as needed for wheezing or shortness of breath.  Dispense: 8 g; Refill: 0 - oseltamivir (TAMIFLU) 75 MG  capsule; Take 1 capsule (75 mg total) by mouth 2 (two) times daily.  Dispense: 10 capsule; Refill: 0  Negative COVID. Classic influenza symptoms. Known exposure -- Brother who tested positive. Supportive measures, OTC medications and Vitamin recommendations reviewed. Will start Tamiflu per orders. Tessalon per orders. Quarantine reviewed with patient.    Follow Up Instructions: I discussed the assessment and treatment plan with the patient. The patient was provided an opportunity to ask questions and all were answered. The patient agreed with the plan and demonstrated an understanding of the instructions.  A copy of instructions were sent to the patient via MyChart unless otherwise noted below.   The patient was advised to call back or seek an in-person evaluation if the symptoms worsen or if the condition fails to improve as anticipated.  Time:  I spent 10 minutes with the patient via telehealth technology discussing the above problems/concerns.    Piedad Climes, PA-C

## 2023-06-19 NOTE — Patient Instructions (Signed)
Angela White, thank you for joining Piedad Climes, PA-C for today's virtual visit.  While this provider is not your primary care provider (PCP), if your PCP is located in our provider database this encounter information will be shared with them immediately following your visit.   A [facility] MyChart account gives you access to today's visit and all your visits, tests, and labs performed at [facility] " click here if you don't have a [facility] MyChart account or go to [email or web address]  Consent: (Patient) Angela White provided verbal consent for this virtual visit at the beginning of the encounter.  Current Medications:  Current Outpatient Medications:    cephALEXin (KEFLEX) 500 MG capsule, Take 1 capsule (500 mg total) by mouth 2 (two) times daily., Disp: 14 capsule, Rfl: 0   fluticasone (FLONASE) 50 MCG/ACT nasal spray, Place 2 sprays into both nostrils daily., Disp: 16 g, Rfl: 0   ipratropium (ATROVENT) 0.03 % nasal spray, Place 2 sprays into both nostrils every 12 (twelve) hours., Disp: 30 mL, Rfl: 0   megestrol (MEGACE) 40 MG tablet, 3x5d, 2x5d, then 1 daily to help control vaginal bleeding. Stop taking when bleeding stops., Disp: 45 tablet, Rfl: 0   Medications ordered in this encounter:  No orders of the defined types were placed in this encounter.    *If you need refills on other medications prior to your next appointment, please contact your pharmacy*  Follow-Up: Call back or seek an in-person evaluation if the symptoms worsen or if the condition fails to improve as anticipated.  [facility] [phone]  Other Instructions Please keep well-hydrated and try to get plenty of rest. If you have a humidifier, place it in the bedroom and run it at night. Start a saline nasal rinse for nasal congestion. You can consider use of a nasal steroid spray like Flonase or Nasacort OTC. You can alternate between Tylenol and  Ibuprofen if needed for fever, body aches, headache and/or throat pain. Salt water-gargles and chloraseptic spray can be very beneficial for sore throat. Mucinex-DM for congestion or cough. Please take all prescribed medications as directed.  Remain out of work until [employer] for 24 hours without a fever-reducing medication, and you are feeling better.  You should mask until symptoms are resolved.  If anything worsens despite treatment, you need to be evaluated in-person. Please do not delay care.  Influenza, Adult Influenza is also called "the flu." It is an infection in the lungs, nose, and throat (respiratory tract). It spreads easily from person to person (is contagious). The flu causes symptoms that are like a cold, along with high fever and body aches. What are the causes? This condition is caused by the influenza virus. You can get the virus by: Breathing in droplets that are in the air after a person infected with the flu coughed or sneezed. Touching something that has the virus on it and then touching your mouth, nose, or eyes. What increases the risk? Certain things may make you more likely to get the flu. These include: Not washing your hands often. Having close contact with many people during cold and flu season. Touching your mouth, eyes, or nose without first washing your hands. Not getting a flu shot every year. You may have a higher risk for the flu, and serious problems, such as a lung infection (pneumonia), if you: Are older than 65. Are pregnant. Have a weakened disease-fighting system (immune system) because of a disease or because you  are taking certain medicines. Have a long-term (chronic) condition, such as: Heart, kidney, or lung disease. Diabetes. Asthma. Have a liver disorder. Are very overweight (morbidly obese). Have anemia. What are the signs or symptoms? Symptoms usually begin suddenly and last 4-14 days. They may include: Fever and chills. Headaches,  body aches, or muscle aches. Sore throat. Cough. Runny or stuffy (congested) nose. Feeling discomfort in your chest. Not wanting to eat as much as normal. Feeling weak or tired. Feeling dizzy. Feeling sick to your stomach or throwing up. How is this treated? If the flu is found early, you can be treated with antiviral medicine. This can help to reduce how bad the illness is and how long it lasts. This may be given by mouth or through an IV tube. Taking care of yourself at home can help your symptoms get better. Your doctor may want you to: Take over-the-counter medicines. Drink plenty of fluids. The flu often goes away on its own. If you have very bad symptoms or other problems, you may be treated in a hospital. Follow these instructions at home:     Activity Rest as needed. Get plenty of sleep. Stay home from work or school as told by your doctor. Do not leave home until you do not have a fever for 24 hours without taking medicine. Leave home only to go to your doctor. Eating and drinking Take an ORS (oral rehydration solution). This is a drink that is sold at pharmacies and stores. Drink enough fluid to keep your pee pale yellow. Drink clear fluids in small amounts as you are able. Clear fluids include: Water. Ice chips. Fruit juice mixed with water. Low-calorie sports drinks. Eat bland foods that are easy to digest. Eat small amounts as you are able. These foods include: Bananas. Applesauce. Rice. Lean meats. Toast. Crackers. Do not eat or drink: Fluids that have a lot of sugar or caffeine. Alcohol. Spicy or fatty foods. General instructions Take over-the-counter and prescription medicines only as told by your doctor. Use a cool mist humidifier to add moisture to the air in your home. This can make it easier for you to breathe. When using a cool mist humidifier, clean it daily. Empty water and replace with clean water. Cover your mouth and nose when you cough or  sneeze. Wash your hands with soap and water often and for at least 20 seconds. This is also important after you cough or sneeze. If you cannot use soap and water, use alcohol-based hand sanitizer. Keep all follow-up visits. How is this prevented?  Get a flu shot every year. You may get the flu shot in late summer, fall, or winter. Ask your doctor when you should get your flu shot. Avoid contact with people who are sick during fall and winter. This is cold and flu season. Contact a doctor if: You get new symptoms. You have: Chest pain. Watery poop (diarrhea). A fever. Your cough gets worse. You start to have more mucus. You feel sick to your stomach. You throw up. Get help right away if you: Have shortness of breath. Have trouble breathing. Have skin or nails that turn a bluish color. Have very bad pain or stiffness in your neck. Get a sudden headache. Get sudden pain in your face or ear. Cannot eat or drink without throwing up. These symptoms may represent a serious problem that is an emergency. Get medical help right away. Call your local emergency services (911 in the [place] Do not wait to see  if the symptoms will go away. Do not drive yourself to the hospital. Summary Influenza is also called "the flu." It is an infection in the lungs, nose, and throat. It spreads easily from person to person. Take over-the-counter and prescription medicines only as told by your doctor. Getting a flu shot every year is the best way to not get the flu. This information is not intended to replace advice given to you by your health care provider. Make sure you discuss any questions you have with your health care provider. Document Revised: 04/16/2020 Document Reviewed: 04/16/2020 Elsevier Patient Education  2023 Elsevier Inc.      If you have been instructed to have an in-person evaluation today at a local Urgent Care facility, please use the link below. It will take you to a list of all of  our available [facility], including address, phone number and hours of operation. Please do not delay care.  [facility]  If you or a family member do not have a primary care provider, use the link below to schedule a visit and establish care. When you choose a [facility] primary care physician or advanced practice provider, you gain a long-term partner in health. Find a Primary Care Provider  Learn more about [facility]'s in-office and virtual care options: [facility] - Get Care Now

## 2023-06-22 DIAGNOSIS — G43909 Migraine, unspecified, not intractable, without status migrainosus: Secondary | ICD-10-CM | POA: Diagnosis not present

## 2023-06-22 DIAGNOSIS — Z20828 Contact with and (suspected) exposure to other viral communicable diseases: Secondary | ICD-10-CM | POA: Diagnosis not present

## 2023-06-22 DIAGNOSIS — Z7689 Persons encountering health services in other specified circumstances: Secondary | ICD-10-CM | POA: Diagnosis not present

## 2023-08-01 DIAGNOSIS — Z0000 Encounter for general adult medical examination without abnormal findings: Secondary | ICD-10-CM | POA: Diagnosis not present

## 2023-08-07 DIAGNOSIS — D72829 Elevated white blood cell count, unspecified: Secondary | ICD-10-CM | POA: Diagnosis not present

## 2023-08-07 DIAGNOSIS — Z0001 Encounter for general adult medical examination with abnormal findings: Secondary | ICD-10-CM | POA: Diagnosis not present

## 2023-08-07 DIAGNOSIS — E785 Hyperlipidemia, unspecified: Secondary | ICD-10-CM | POA: Diagnosis not present

## 2023-08-07 DIAGNOSIS — Z0000 Encounter for general adult medical examination without abnormal findings: Secondary | ICD-10-CM | POA: Diagnosis not present

## 2023-08-07 DIAGNOSIS — G43909 Migraine, unspecified, not intractable, without status migrainosus: Secondary | ICD-10-CM | POA: Diagnosis not present

## 2023-08-30 DIAGNOSIS — R42 Dizziness and giddiness: Secondary | ICD-10-CM | POA: Diagnosis not present

## 2023-08-30 DIAGNOSIS — G43909 Migraine, unspecified, not intractable, without status migrainosus: Secondary | ICD-10-CM | POA: Diagnosis not present

## 2023-09-10 DIAGNOSIS — G43909 Migraine, unspecified, not intractable, without status migrainosus: Secondary | ICD-10-CM | POA: Diagnosis not present

## 2023-09-10 DIAGNOSIS — F4322 Adjustment disorder with anxiety: Secondary | ICD-10-CM | POA: Diagnosis not present

## 2023-11-19 DIAGNOSIS — L700 Acne vulgaris: Secondary | ICD-10-CM | POA: Diagnosis not present

## 2024-01-01 DIAGNOSIS — L700 Acne vulgaris: Secondary | ICD-10-CM | POA: Diagnosis not present

## 2024-01-30 ENCOUNTER — Ambulatory Visit: Admitting: Women's Health

## 2024-01-30 ENCOUNTER — Encounter: Payer: Self-pay | Admitting: Women's Health

## 2024-01-30 VITALS — BP 122/82 | HR 82 | Ht 60.0 in | Wt 160.0 lb

## 2024-01-30 DIAGNOSIS — Z3046 Encounter for surveillance of implantable subdermal contraceptive: Secondary | ICD-10-CM

## 2024-01-30 DIAGNOSIS — Z3202 Encounter for pregnancy test, result negative: Secondary | ICD-10-CM

## 2024-01-30 LAB — POCT URINE PREGNANCY: Preg Test, Ur: NEGATIVE

## 2024-01-30 MED ORDER — ETONOGESTREL 68 MG ~~LOC~~ IMPL
68.0000 mg | DRUG_IMPLANT | Freq: Once | SUBCUTANEOUS | Status: AC
  Administered 2024-01-30: 68 mg via SUBCUTANEOUS

## 2024-01-30 NOTE — Progress Notes (Signed)
 NEXPLANON  REMOVAL AND RE-INSERTION Patient name: Angela White MRN [id number]  Date of birth: 09-15-1995 Subjective Findings:   Angela White is a 28 y.o. G47P1001 Hispanic female being seen today for Nexplanon  removal and re-insertion. Her Nexplanon  was placed 12/13/20.   No LMP recorded. Patient has had an implant. Last pap9/13/22. Results were: NILM w/ HRHPV not done  Risks/benefits/side effects of Nexplanon  have been discussed and her questions have been answered.  Specifically, a failure rate of 09/998 has been reported, with an increased failure rate if pt takes St. John's Wort and/or antiseizure medicaitons.  She is aware of the common side effect of irregular bleeding, which the incidence of decreases over time. Signed copy of informed consent in chart.      05/24/2021   10:11 AM 04/03/2017    2:20 PM 03/15/2017    2:56 PM  Depression screen PHQ 2/9  Decreased Interest 0 0 0  Down, Depressed, Hopeless 1 0 0  PHQ - 2 Score 1 0 0  Altered sleeping 1 1   Tired, decreased energy 1 1   Change in appetite 1 0   Feeling bad or failure about yourself  1 0   Trouble concentrating 1 0   Moving slowly or fidgety/restless 1 0   Suicidal thoughts 0 0   PHQ-9 Score 7 2         05/24/2021   10:11 AM  GAD 7 : Generalized Anxiety Score  Nervous, Anxious, on Edge 1  Control/stop worrying 1  Worry too much - different things 1  Trouble relaxing 1  Restless 1  Easily annoyed or irritable 1  Afraid - awful might happen 1  Total GAD 7 Score 7     Pertinent History Reviewed:   Reviewed past medical,surgical, social, obstetrical and family history.  Reviewed problem list, medications and allergies. Objective Findings & Procedure:    Vitals:   01/30/24 1038  BP: 122/82  Pulse: 82  Weight: 160 lb (72.6 kg)  Height: 5' (1.524 m)  Body mass index is 31.25 kg/m.  Results for orders placed or performed in visit on 01/30/24 (from the past 24 hours)  POCT urine pregnancy    Collection Time: 01/30/24 10:39 AM  Result Value Ref Range   Preg Test, Ur Negative Negative     Time out was performed.  Nexplanon  site identified.  Area prepped in usual sterile fashon. Two cc's of 2% lidocaine  was used to anesthetize the area. A small stab incision was made right beside the implant on the distal portion.  The Nexplanon  rod was grasped using hemostats and removed intact without difficulty.  The area was cleansed again with betadine and the Nexplanon  was inserted approximately 10cm from the medial epicondyle and 3-5cm posterior to the sulcus per manufacturer's recommendations without difficulty.  Steri-strips and a pressure bandage was applied.  There was less than 3 cc blood loss. There were no complications.  The patient tolerated the procedure well. Assessment & Plan:   1) Nexplanon  removal & re-insertion She was instructed to keep the area clean and dry, remove pressure bandage in 24 hours, and keep insertion site covered with the steri-strips for 3-5 days.  She was given a card indicating date Nexplanon  was inserted and date it needs to be removed.  Follow-up PRN problems.  Orders Placed This Encounter  Procedures   POCT urine pregnancy    Follow-up: Return in about 3 months (around 05/01/2024) for Pap & physical.  Ferd Householder  CNM, WHNP-BC 01/30/2024 10:52 AM

## 2024-01-30 NOTE — Patient Instructions (Signed)
 Keep the area clean and dry.  You can remove the big bandage in 24 hours, and the small steri-strip bandage in 3-5 days.  A back up method, such as condoms, should be used for two weeks. You may have irregular vaginal bleeding for the first 6 months after the Nexplanon is placed, then the bleeding usually lightens and it is possible that you may not have any periods.  If you have any concerns, please give [place] a call.    Etonogestrel Implant What is this medication? ETONOGESTREL (et oh noe JES trel) prevents ovulation and pregnancy. It belongs to a group of medications called contraceptives. This medication is a progestin hormone. This medicine may be used for other purposes; ask your health care provider or pharmacist if you have questions. COMMON BRAND NAME(S): Implanon, Nexplanon What should I tell my care team before I take this medication? They need to know if you have any of these conditions: Abnormal vaginal bleeding Blood clots Blood vessel disease Breast, cervical, endometrial, ovarian, liver, or uterine cancer Diabetes Gallbladder disease Heart disease or recent heart attack High blood pressure High cholesterol or triglycerides Kidney disease Liver disease Migraine headaches Seizures Stroke Tobacco use An unusual or allergic reaction to etonogestrel, other medications, foods, dyes, or preservatives Pregnant or trying to get pregnant Breastfeeding How should I use this medication? This device is inserted just under the skin on the inner side of your upper arm by your care team. Talk to your care team about the use of this medication in children. Special care may be needed. Overdosage: If you think you have taken too much of this medicine contact a poison control center or emergency room at once. NOTE: This medicine is only for you. Do not share this medicine with others. What if I miss a dose? This does not apply. What may interact with this medication? Do not take this  medication with any of the following: Amprenavir Fosamprenavir This medication may also interact with the following: Acitretin Aprepitant Armodafinil Bexarotene Bosentan Carbamazepine Certain antivirals for HIV or hepatitis Certain medications for fungal infections, such as fluconazole, ketoconazole, itraconazole, or voriconazole Cyclosporine Felbamate Griseofulvin Lamotrigine Modafinil Oxcarbazepine Phenobarbital Phenytoin Primidone Rifabutin Rifampin Rifapentine St. John's wort Topiramate This list may not describe all possible interactions. Give your health care provider a list of all the medicines, herbs, non-prescription drugs, or dietary supplements you use. Also tell them if you smoke, drink alcohol, or use illegal drugs. Some items may interact with your medicine. What should I watch for while using this medication? Visit your care team for regular checks on your progress. Using this medication does not protect you or your partner against HIV or other sexually transmitted infections (STIs). You should be able to feel the implant by pressing your fingertips over the skin where it was inserted. Contact your care team if you cannot feel the implant, and use a non-hormonal birth control method (such as condoms) until your care team confirms that the implant is in place. Contact your care team if you think that the implant may have broken or become bent while in your arm. You will receive a user card from your care team after the implant is inserted. The card is a record of the location of the implant in your upper arm and when it should be removed. Keep this card with your health records. What side effects may I notice from receiving this medication? Side effects that you should report to your care team as soon as  possible: Allergic reactions--skin rash, itching, hives, swelling of the face, lips, tongue, or throat Blood clot--pain, swelling, or warmth in the leg, shortness of  breath, chest pain Gallbladder problems--severe stomach pain, nausea, vomiting, fever Increase in blood pressure Liver injury--right upper belly pain, loss of appetite, nausea, light-colored stool, dark yellow or brown urine, yellowing skin or eyes, unusual weakness or fatigue New or worsening migraines or headaches Pain, redness, or irritation at injection site Stroke--sudden numbness or weakness of the face, arm, or leg, trouble speaking, confusion, trouble walking, loss of balance or coordination, dizziness, severe headache, change in vision Unusual vaginal discharge, itching, or odor Worsening mood, feelings of depression Side effects that usually do not require medical attention (report to your care team if they continue or are bothersome): Breast pain or tenderness Dark patches of skin on the face or other sun-exposed areas Irregular menstrual cycles or spotting Nausea Weight gain This list may not describe all possible side effects. Call your doctor for medical advice about side effects. You may report side effects to FDA at 1-800-FDA-1088. Where should I keep my medication? This medication is given in a hospital or clinic and will not be stored at home. NOTE: This sheet is a summary. It may not cover all possible information. If you have questions about this medicine, talk to your doctor, pharmacist, or health care provider.  2024 Elsevier/Gold Standard (2022-04-04 00:00:00)

## 2024-02-12 DIAGNOSIS — L700 Acne vulgaris: Secondary | ICD-10-CM | POA: Diagnosis not present

## 2024-03-23 DIAGNOSIS — R030 Elevated blood-pressure reading, without diagnosis of hypertension: Secondary | ICD-10-CM | POA: Diagnosis not present

## 2024-03-23 DIAGNOSIS — Z6832 Body mass index (BMI) 32.0-32.9, adult: Secondary | ICD-10-CM | POA: Diagnosis not present

## 2024-03-23 DIAGNOSIS — J019 Acute sinusitis, unspecified: Secondary | ICD-10-CM | POA: Diagnosis not present

## 2024-03-23 DIAGNOSIS — R059 Cough, unspecified: Secondary | ICD-10-CM | POA: Diagnosis not present

## 2024-04-05 DIAGNOSIS — E669 Obesity, unspecified: Secondary | ICD-10-CM | POA: Diagnosis not present

## 2024-04-05 DIAGNOSIS — R21 Rash and other nonspecific skin eruption: Secondary | ICD-10-CM | POA: Diagnosis not present

## 2024-04-05 DIAGNOSIS — R030 Elevated blood-pressure reading, without diagnosis of hypertension: Secondary | ICD-10-CM | POA: Diagnosis not present

## 2024-04-05 DIAGNOSIS — Z6832 Body mass index (BMI) 32.0-32.9, adult: Secondary | ICD-10-CM | POA: Diagnosis not present

## 2024-05-02 DIAGNOSIS — L439 Lichen planus, unspecified: Secondary | ICD-10-CM | POA: Diagnosis not present

## 2024-05-07 DIAGNOSIS — L309 Dermatitis, unspecified: Secondary | ICD-10-CM | POA: Diagnosis not present

## 2024-05-07 DIAGNOSIS — R21 Rash and other nonspecific skin eruption: Secondary | ICD-10-CM | POA: Diagnosis not present

## 2024-05-28 DIAGNOSIS — L42 Pityriasis rosea: Secondary | ICD-10-CM | POA: Diagnosis not present

## 2024-08-04 ENCOUNTER — Ambulatory Visit: Admitting: Women's Health
